# Patient Record
Sex: Female | Born: 1945 | Race: White | Hispanic: No | Smoking: Former smoker
Health system: Southern US, Community
[De-identification: ages and names within clinical notes are randomized; demographics above are authoritative.]

## PROBLEM LIST (undated history)

## (undated) DIAGNOSIS — R32 Unspecified urinary incontinence: Secondary | ICD-10-CM

## (undated) DIAGNOSIS — G14 Postpolio syndrome: Secondary | ICD-10-CM

## (undated) DIAGNOSIS — E785 Hyperlipidemia, unspecified: Secondary | ICD-10-CM

## (undated) DIAGNOSIS — J309 Allergic rhinitis, unspecified: Secondary | ICD-10-CM

## (undated) DIAGNOSIS — F32A Depression, unspecified: Secondary | ICD-10-CM

## (undated) DIAGNOSIS — J45909 Unspecified asthma, uncomplicated: Secondary | ICD-10-CM

## (undated) DIAGNOSIS — I10 Essential (primary) hypertension: Secondary | ICD-10-CM

## (undated) DIAGNOSIS — M8580 Other specified disorders of bone density and structure, unspecified site: Secondary | ICD-10-CM

## (undated) DIAGNOSIS — M419 Scoliosis, unspecified: Secondary | ICD-10-CM

## (undated) DIAGNOSIS — F329 Major depressive disorder, single episode, unspecified: Secondary | ICD-10-CM

## (undated) DIAGNOSIS — F419 Anxiety disorder, unspecified: Secondary | ICD-10-CM

## (undated) HISTORY — PX: OTHER SURGICAL HISTORY: SHX169

## (undated) HISTORY — DX: Major depressive disorder, single episode, unspecified: F32.9

## (undated) HISTORY — PX: TUBAL LIGATION: SHX77

## (undated) HISTORY — DX: Essential (primary) hypertension: I10

## (undated) HISTORY — DX: Anxiety disorder, unspecified: F41.9

## (undated) HISTORY — DX: Hyperlipidemia, unspecified: E78.5

## (undated) HISTORY — DX: Postpolio syndrome: G14

## (undated) HISTORY — DX: Other specified disorders of bone density and structure, unspecified site: M85.80

## (undated) HISTORY — PX: BREAST BIOPSY: SHX20

## (undated) HISTORY — DX: Allergic rhinitis, unspecified: J30.9

## (undated) HISTORY — DX: Unspecified asthma, uncomplicated: J45.909

## (undated) HISTORY — DX: Depression, unspecified: F32.A

## (undated) HISTORY — DX: Scoliosis, unspecified: M41.9

## (undated) HISTORY — DX: Unspecified urinary incontinence: R32

---
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
## 1967-08-22 HISTORY — PX: CHOLECYSTECTOMY: SHX55

## 1977-08-21 HISTORY — PX: BACK SURGERY: SHX140

## 2008-04-12 ENCOUNTER — Ambulatory Visit: Payer: Self-pay | Admitting: Infectious Diseases

## 2008-04-12 ENCOUNTER — Inpatient Hospital Stay (HOSPITAL_COMMUNITY): Admission: EM | Admit: 2008-04-12 | Discharge: 2008-04-13 | Payer: Self-pay

## 2010-07-21 LAB — HEPATIC FUNCTION PANEL
AST: 35 U/L (ref 13–35)
Alkaline Phosphatase: 96 U/L (ref 25–125)
Bilirubin, Total: 1 mg/dL

## 2010-07-21 LAB — LIPID PANEL
Cholesterol: 162 mg/dL (ref 0–200)
LDL Cholesterol: 89 mg/dL

## 2010-07-21 LAB — BASIC METABOLIC PANEL: Glucose: 86 mg/dL

## 2010-09-11 ENCOUNTER — Encounter: Payer: Self-pay | Admitting: Family Medicine

## 2011-01-03 NOTE — Discharge Summary (Signed)
Wendy Yates, Wendy Yates               ACCOUNT NO.:  [id number]   MEDICAL RECORD NO.:  [id number]          PATIENT TYPE:  INP   LOCATION:  6730                         FACILITY:  [facility]   PHYSICIAN:  Mick Sell, MD DATE OF BIRTH:  Nov 09, 1945   DATE OF ADMISSION:  04/12/2008  DATE OF DISCHARGE:  04/13/2008                               DISCHARGE SUMMARY   DISCHARGE DIAGNOSES:  1. Fevers, myalgia, headache, and rash.  2. Depression.  3. Postpolio syndrome.  4. Seasonal allergies.  5. Hyperlipidemia.   DISCHARGE MEDICATIONS:  1. Singulair 10 mg p.o. daily.  2. Zoloft 50 mg p.o. daily.  3. Buspirone 50 mg p.o. b.i.d.  4. Lipitor 10 mg p.o. daily.  5. Doxycycline 100 mg p.o. b.i.d. x6 days.  6. Ibuprofen 400 mg p.o. q.6 h. p.r.n. pain.   DISPOSITION AND FOLLOWUP:  The patient is to return to the outpatient  clinic on May 07, 2008 at 2:30 p.m. to see Dr. Comer Locket.  The  patient will be a new patient in our clinic and her PCP should be Dr.  Joaquin Courts.  The patient will need to have a repeat CMET, UA, and a  repeat [facility] spotted fever serology.  The patient should also  have her RPR and her RMSF that was checked at admission to make sure  those were negative.  The patient also should check for resolution of  her rash as well as her left knee arthritis pain.  The patient will need  a repeat CT of the chest to follow up her small lung nodule.  This  should be done in 3-6 months.   PROCEDURES PERFORMED:  The patient had a chest x-ray done on April 12, 2008.  Impression:  1. No acute cardiopulmonary disease.  2. Thoracolumbar scoliosis with Harrington rod.   The patient has CT of the abdomen with and without contrast.  Impression:  No acute abnormality.  There is an 8-mm right lower lobe  lung nodule.  Chest CT will be useful evaluating for other lobe nodules  establishing a baseline for followup.   The patient also had a CT of the pelvis done on April 12, 2008.  Impression:  No acute abnormality.   There were no consultation made during hospitalization.   Brief H and P is as follows:   CHIEF COMPLAINT:  Body aches, fevers, and rash.   The patient's PCP is Dr. Michelle Nasuti in [place], but she is going to be  moving to [place].   HISTORY OF PRESENT ILLNESS:  The patient is a 65 year old with past  medical history significant for postpolio syndrome, presenting to the ER  with her sister with body aches and fevers over the last 2 days.  She  was treated at an Urgent Care in [place] for UTI and started on  Macrobid on April 04, 2008, but was notified by the Urgent Care Clinic  that the organism that grew from her urine was resistant and she was  switched to Bactrim 3 days prior to admission.  Her dysuria and urinary  frequency have somewhat improved.  However, she has developed fevers,  rigors, myalgias, headache, and back pain since.  She reports good  compliance with her antibiotics.  She denies cough, chest pain, and  shortness of breath.  She does have significant suprapubic abdominal  pain and nausea, but no vomiting and is tolerating p.o.'s.  She has been  visiting her sister at her [place] at [place] recently and notes her  dog had 2 tick bites, but no known bites or so.   ALLERGIES:  SULFA, which caused a rash and myalgias and question serum  sickness-like syndrome.   PAST MEDICAL HISTORY:  1. History of polio and chronic pain.  2. Recent UTI with resistant organism not sure of what that is at this      time.  3. Seasonal allergies.  4. Hyperlipidemia.  5. Depression/anxiety.   HOME MEDICATIONS:  1. Singulair 10 mg p.o. nightly.  2. Zoloft 50 mg p.o. daily.  3. Buspirone 15 mg p.o. b.i.d.  4. Lipitor 10 mg daily.   SUBSTANCE HISTORY:  She is a current smoker, half a pack a day x20  years.  She denies alcohol, cocaine, or IV drugs.  She is divorced,  disabled from polio.  She has Medicare.  She currently lives  alone, but  has a supportive sister.   FAMILY HISTORY:  Mother is 1 and living.  She has 2 healthy siblings  and her father has diabetes mellitus.   REVIEW OF SYSTEMS:  As mentioned in the HPI.   PHYSICAL EXAMINATION:  VITAL SIGNS:  Temperature equals 100.3, blood  pressure 120/80, pulse equals 111, respiratory rate equals 20, and O2  sats 95% on room air.  GENERAL:  She is alert and oriented x3 and pleasant, no distress.  EYES:  PERRLA, EOMI.  ENT:  MMM.  NECK:  Supple.  No lymphadenopathy.  RESPIRATORY:  Decreased air entry in the bases, normal effort.  No  crackles or wheezing.  CV:  Borderline tachycardia, regular rhythm, question of systolic  ejection murmur 1/6 best heard at the lower sternal border.  No rubs.  No gallops.  GI:  Positive bowel sounds, soft, tenderness to palpation  in the suprapubic region and right and left lower quadrants, and without  rebound or guarding.  Well-healed incisions noted.  EXTREMITIES:  No edema, multi well-healed incisions.  SKIN:  Erythematous rash, macular in nature predominantly around her  scars, worse in the bilateral lower extremities and antecubital fossa.  LYMPH:  No cervical lymphadenopathy.  MUSCULOSKELETAL:  Strength mildly decreased in her lower extremities,  but the patient notes this is chronic.  NEUROLOGIC:  Nonfocal.  PSYCH:  Mood is normal.  No hallucinations or delusions.   INITIAL LABORATORY DATA:  Sodium 134, potassium 4.1, chloride 101,  bicarb 29, BUN 14, creatinine 0.9, and glucose 122.  WBC 11.4,  hemoglobin 13.9, platelets 220, ANC 10.9, and MCV 93.6.  UA cloudy,  specific gravity 1.030, small amount of bilirubin, ketones equals 15,  nitrite negative, leukocytes small, wbc's 0-2, and rbc's 0-2.  Chest x-  ray, no acute findings.  Thoracolumbar scoliosis with rod noted.   HOSPITAL COURSE BY PROBLEM:  1. Fevers, myalgias, headache, and rash, etiology includes infectious      versus drug reaction given the recent  sulfa initiation and her      symptoms.  The patient was put on ceftriaxone and doxycycline for      possible RMSF.  Her Bactrim was also discontinued and added to her  allergy list.  The patient did have this rash in the antecubital as      well as in her ankle, it is usually along scars.  She felt much      better the second day and wanted to go home.  Of note, she also had      elevated liver enzymes, which could occur with RMSF or drug      reactions.  She will need outpatient follow up to make sure that      her liver enzymes have returned to normal.  The patient should also      have a repeat urinalysis to make sure that she does not have      infection.  The patient will have RPR and RMSF serology checked      prior to discharge, which will need to be checked in the outpatient      setting.  She will also need repeat RMSF serology at this time.      She will follow up in the outpatient clinic and become our patient      whose PCP will be Dr. Joaquin Courts.  The patient will be      discharged on doxycycline 100 mg p.o. b.i.d. x6 more days.  The      patient will be instructed that she gets worse or does not feel      well that she should come back into the ER, call the clinic      immediately.  2. Depression/anxiety.  The patient will be continued on her home      medicines and will be discharged on Zoloft and buspirone.  3. Postpolio syndrome.  The patient will be sent home with ibuprofen.      This will also help with her left knee arthritis, which she      experienced while she is in the hospital.  4. Seasonal allergies.  The patient will be continued on Singulair.  5. Hyperlipidemia.  The patient will be continued on her Lipitor and      she will need a FLP if this has not been done recently.   DISCHARGE LABORATORY DATA:  Blood culture negative x2.  Sodium 136,  potassium 3.9, chloride 106, bicarb 24, glucose 106, BUN 5, creatinine  0.49, and calcium 7.9.  Her WBC is  6.8, hemoglobin 11.9, hematocrit  35.9, platelets 183, and ESR 23.  Of note, she also had a CMET, which  showed total bilirubin 1.1, alkaline phosphatase 115, AST 84, ALT 38,  total protein 6.2, albumin 3.6, calcium 8.9.   DISCHARGE VITAL SIGNS:  Temperature 98.1, pulse 87, respiratory rate 22,  blood pressure 114/74, and O2 sats 98% on room air.     Rufina Falco, M.D.  Electronically Signed      Mick Sell, MD  Electronically Signed   JY/MEDQ  D:  04/13/2008  T:  04/14/2008  Job:  [id number]   cc:   Blondell Reveal, MD

## 2011-10-25 ENCOUNTER — Telehealth: Payer: Self-pay | Admitting: Internal Medicine

## 2011-10-25 NOTE — Telephone Encounter (Signed)
Received copies from [facility] ,on3/6/13 . Forwarded 20  pages to Dr. ,for review.

## 2011-10-26 ENCOUNTER — Telehealth: Payer: Self-pay | Admitting: Internal Medicine

## 2011-10-26 NOTE — Telephone Encounter (Signed)
Received 44 pages of medical records from [facility], sent to Dr. Mady Haagensen. sd 10/26/2011.

## 2011-11-28 ENCOUNTER — Ambulatory Visit (INDEPENDENT_AMBULATORY_CARE_PROVIDER_SITE_OTHER): Payer: Medicare Other | Admitting: Internal Medicine

## 2011-11-28 ENCOUNTER — Encounter: Payer: Self-pay | Admitting: Internal Medicine

## 2011-11-28 ENCOUNTER — Other Ambulatory Visit (INDEPENDENT_AMBULATORY_CARE_PROVIDER_SITE_OTHER): Payer: Medicare Other

## 2011-11-28 VITALS — BP 142/92 | HR 80 | Temp 97.9°F | Ht 60.0 in | Wt 164.8 lb

## 2011-11-28 DIAGNOSIS — F419 Anxiety disorder, unspecified: Secondary | ICD-10-CM | POA: Insufficient documentation

## 2011-11-28 DIAGNOSIS — Z1239 Encounter for other screening for malignant neoplasm of breast: Secondary | ICD-10-CM

## 2011-11-28 DIAGNOSIS — Z0000 Encounter for general adult medical examination without abnormal findings: Secondary | ICD-10-CM

## 2011-11-28 DIAGNOSIS — I10 Essential (primary) hypertension: Secondary | ICD-10-CM

## 2011-11-28 DIAGNOSIS — M4120 Other idiopathic scoliosis, site unspecified: Secondary | ICD-10-CM

## 2011-11-28 DIAGNOSIS — B91 Sequelae of poliomyelitis: Secondary | ICD-10-CM

## 2011-11-28 DIAGNOSIS — J309 Allergic rhinitis, unspecified: Secondary | ICD-10-CM | POA: Insufficient documentation

## 2011-11-28 DIAGNOSIS — F329 Major depressive disorder, single episode, unspecified: Secondary | ICD-10-CM | POA: Insufficient documentation

## 2011-11-28 DIAGNOSIS — J45909 Unspecified asthma, uncomplicated: Secondary | ICD-10-CM | POA: Insufficient documentation

## 2011-11-28 DIAGNOSIS — M419 Scoliosis, unspecified: Secondary | ICD-10-CM

## 2011-11-28 DIAGNOSIS — E039 Hypothyroidism, unspecified: Secondary | ICD-10-CM

## 2011-11-28 DIAGNOSIS — M8580 Other specified disorders of bone density and structure, unspecified site: Secondary | ICD-10-CM | POA: Insufficient documentation

## 2011-11-28 DIAGNOSIS — E785 Hyperlipidemia, unspecified: Secondary | ICD-10-CM

## 2011-11-28 DIAGNOSIS — Z23 Encounter for immunization: Secondary | ICD-10-CM

## 2011-11-28 DIAGNOSIS — Z1211 Encounter for screening for malignant neoplasm of colon: Secondary | ICD-10-CM

## 2011-11-28 DIAGNOSIS — G14 Postpolio syndrome: Secondary | ICD-10-CM | POA: Insufficient documentation

## 2011-11-28 LAB — LIPID PANEL
Cholesterol: 185 mg/dL (ref 0–200)
LDL Cholesterol: 102 mg/dL — ABNORMAL HIGH (ref 0–99)
Total CHOL/HDL Ratio: 4
Triglycerides: 163 mg/dL — ABNORMAL HIGH (ref 0.0–149.0)
VLDL: 32.6 mg/dL (ref 0.0–40.0)

## 2011-11-28 LAB — CBC WITH DIFFERENTIAL/PLATELET
Basophils Relative: 0.6 % (ref 0.0–3.0)
Eosinophils Absolute: 0.4 10*3/uL (ref 0.0–0.7)
Eosinophils Relative: 7.6 % — ABNORMAL HIGH (ref 0.0–5.0)
HCT: 45.3 % (ref 36.0–46.0)
Lymphs Abs: 2.1 10*3/uL (ref 0.7–4.0)
MCHC: 33.2 g/dL (ref 30.0–36.0)
MCV: 91.4 fl (ref 78.0–100.0)
Monocytes Absolute: 0.5 10*3/uL (ref 0.1–1.0)
Neutro Abs: 2 10*3/uL (ref 1.4–7.7)
RBC: 4.95 Mil/uL (ref 3.87–5.11)
WBC: 4.9 10*3/uL (ref 4.5–10.5)

## 2011-11-28 LAB — TSH: TSH: 2.09 u[IU]/mL (ref 0.35–5.50)

## 2011-11-28 LAB — HEPATIC FUNCTION PANEL
Albumin: 4.7 g/dL (ref 3.5–5.2)
Bilirubin, Direct: 0.2 mg/dL (ref 0.0–0.3)
Total Protein: 7.5 g/dL (ref 6.0–8.3)

## 2011-11-28 LAB — BASIC METABOLIC PANEL
CO2: 30 mEq/L (ref 19–32)
Chloride: 101 mEq/L (ref 96–112)
Potassium: 3.8 mEq/L (ref 3.5–5.1)

## 2011-11-28 MED ORDER — SERTRALINE HCL 50 MG PO TABS: 50.0000 mg | ORAL_TABLET | Freq: Every day | ORAL | Status: DC

## 2011-11-28 MED ORDER — MONTELUKAST SODIUM 10 MG PO TABS: 10.0000 mg | ORAL_TABLET | Freq: Every day | ORAL | Status: DC

## 2011-11-28 MED ORDER — BUSPIRONE HCL 15 MG PO TABS: 15.0000 mg | ORAL_TABLET | Freq: Two times a day (BID) | ORAL | Status: DC

## 2011-11-28 NOTE — Assessment & Plan Note (Signed)
In Lipitor for same since 2011 Check lipids now and annually - titrate as needed

## 2011-11-28 NOTE — Progress Notes (Signed)
Subjective:    Patient ID: Wendy Yates, female    DOB: 17-Sep-1945, 66 y.o.   MRN: [id number]  HPI  New patient to me and our practice, here today to establish care Also patient is here today for annual physical. Patient feels well overall.  Reviewed chronic medical issues today: Hypertension. History of same but has declined medication treatment for same. Reports related to pain with exertion and weight gain. (25 pound weight gain since 2011 related to tobacco cessation, down 10 pounds since largest weight)  Dyslipidemia. On atorvastatin since 2011. the patient reports compliance with medication(s) as prescribed. Denies adverse side effects.  Postpolio syndrome. Polio diagnosed age 18. Chronic right lower extremity weakness, no significant change over past 5 years per patient. Uses e-stim at home as needed since 2007. Reports balance and activity have improved since joining silver sneakers and 2011, works out 4 days per week - walks with cane assistance when out of the home, has electronic wheelchair for inside the home mobility.  Scoliosis, severe. Status post Harrington rod surgery in 1979. e-stim ongoing at home as needed for pain. Uses only Aleve for unrelieved pain. ?new back brace  Past Medical History  Diagnosis Date  . Asthma, extrinsic   . Hypertension   . Osteopenia     DEXA 12/19/07: -2.1; 08/17/04: -1.6  . Hyperlipidemia   . Post-polio syndrome     polio age 63,  RLE most affected  . Depression   . Anxiety   . Urine incontinence   . Allergic rhinitis, cause unspecified   . Scoliosis     1979 harrington rod surgery   Family History  Problem Relation Age of Onset  . Alcohol abuse Mother   . Arthritis Mother   . Arthritis Father    History  Substance Use Topics  . Smoking status: Former Smoker -- 0.5 packs/day for 10 years    Types: Cigarettes    Quit date: 08/21/2009  . Smokeless tobacco: Not on file  . Alcohol Use: No     Review of Systems Constitutional:  Negative for fever or unexpected weight change.  Respiratory: Negative for cough and shortness of breath.   Cardiovascular: Negative for chest pain or palpitations.  Gastrointestinal: Negative for abdominal pain, no bowel changes.  Musculoskeletal: Negative for gait problem or joint swelling.  Skin: Negative for rash.  Neurological: Negative for dizziness or headache.  No other specific complaints in a complete review of systems (except as listed in HPI above).     Objective:   Physical Exam BP 142/92  Pulse 80  Temp(Src) 97.9 F (36.6 C) (Oral)  Ht 5' (1.524 m)  Wt 164 lb 12.8 oz (74.753 kg)  BMI 32.19 kg/m2  SpO2 95% Wt Readings from Last 3 Encounters:  11/28/11 164 lb 12.8 oz (74.753 kg)   Constitutional: She is short stature, overweight, but appears well-developed and well-nourished. No distress.  friend (neighbor) at side HENT: Head: Normocephalic and atraumatic. Ears: B TMs ok, no erythema or effusion; Nose: Nose normal.  Mouth/Throat: Oropharynx is clear and moist. No oropharyngeal exudate.  Eyes: Conjunctivae and EOM are normal. Pupils are equal, round, and reactive to light. No scleral icterus.  Neck: Normal range of motion. Neck supple. No JVD present. No thyromegaly present.  Cardiovascular: Normal rate, regular rhythm and normal heart sounds.  No murmur heard. No BLE edema. Pulmonary/Chest: Effort normal and breath sounds normal. No respiratory distress. She has no wheezes.  Abdominal: Soft. Bowel sounds are normal. She exhibits no  distension. There is no tenderness. no masses Musculoskeletal: Significant rightward scoliosis. 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 Status post hammertoe repair bilaterally.  Neurological: She is alert and oriented to person, place, and time. No cranial nerve deficit. Coordination normal. Weak (3+/5) R hip flexors Skin: Skin is warm and dry. No rash noted. No erythema.  Psychiatric: She  has a normal mood and affect. Her behavior is normal. Judgment and thought content normal.   Lab Results  Component Value Date   CHOL 162 07/21/2010   TRIG 122 07/21/2010   HDL 49 07/21/2010   LDLCALC 89 07/21/2010   ALT 19 07/21/2010   AST 35 07/21/2010   NA 137 07/21/2010   K 4.2 07/21/2010   CREATININE 0.5 07/21/2010   BUN 13 07/21/2010       Assessment & Plan:  CPX/v70.0 - Patient has been counseled on age-appropriate routine health concerns for screening and prevention. These are reviewed and up-to-date (pt agrees to colo and mammo after discussion). Immunizations are up-to-date or declined. Labs ordered and will be reviewed.  Also See problem list. Medications and labs reviewed today. Records from former PCP provider in [place] at [facility] reviewed and copied into electronic record today

## 2011-11-28 NOTE — Assessment & Plan Note (Signed)
Describes fatigue but no progression of chronic RLE weakness Very functional - works out 4d/week with silver sneakers - Using E stim at home for pain relief and uses cane for ambulation assistance -  see discussion above re: overlap of symptoms with scoliosis syndrome

## 2011-11-28 NOTE — Assessment & Plan Note (Signed)
Declines med treatment for same Working on weight reduction and DASH diet -  Will watch for now without treatment at pt request BP Readings from Last 3 Encounters:  11/28/11 142/92

## 2011-11-28 NOTE — Assessment & Plan Note (Signed)
Back symptoms and exam complicated by overlapping postpolio syndrome affecting right lower extremity. Patient requesting brace to assist with upright position. Last PT done 2007 - uses E stim as needed at home for pain control Will refer to orthopedic spine for review and evaluation, but may need neurologic evaluation if felt to be related to progression of postpolio syndrome rather than scoliosis deficit

## 2011-11-28 NOTE — Patient Instructions (Signed)
It was good to see you today. We have reviewed your prior records including labs and tests today Test(s) ordered today. Your results will be called to you after review (48-72hours after test completion). If any changes need to be made, you will be notified at that time. Medications reviewed, no changes at this time. Refill on medication(s) as discussed today. we'll make referral to GI for colonoscopy and for mammogram screening. Our office will contact you regarding appointment(s) once made. Health Maintenance reviewed - Pneumovax given - other screening referrals as above. Please schedule followup in 3-4 months for recheck and review, call sooner if problems.

## 2011-12-18 ENCOUNTER — Other Ambulatory Visit: Payer: Self-pay

## 2011-12-18 MED ORDER — ALBUTEROL SULFATE HFA 108 (90 BASE) MCG/ACT IN AERS: 2.0000 | INHALATION_SPRAY | Freq: Four times a day (QID) | RESPIRATORY_TRACT | Status: DC | PRN

## 2011-12-19 ENCOUNTER — Ambulatory Visit
Admission: RE | Admit: 2011-12-19 | Discharge: 2011-12-19 | Disposition: A | Discharge status: Home / Self Care | Payer: Medicare Other | Source: Ambulatory Visit | Attending: Internal Medicine | Admitting: Internal Medicine

## 2011-12-19 DIAGNOSIS — Z1239 Encounter for other screening for malignant neoplasm of breast: Secondary | ICD-10-CM

## 2012-02-27 ENCOUNTER — Encounter: Payer: Self-pay | Admitting: Internal Medicine

## 2012-02-27 ENCOUNTER — Ambulatory Visit (INDEPENDENT_AMBULATORY_CARE_PROVIDER_SITE_OTHER)
Admission: RE | Admit: 2012-02-27 | Discharge: 2012-02-27 | Disposition: A | Discharge status: Home / Self Care | Payer: Medicare Other | Source: Ambulatory Visit

## 2012-02-27 ENCOUNTER — Ambulatory Visit (INDEPENDENT_AMBULATORY_CARE_PROVIDER_SITE_OTHER): Payer: Medicare Other | Admitting: Internal Medicine

## 2012-02-27 ENCOUNTER — Encounter: Payer: Self-pay | Admitting: Gastroenterology

## 2012-02-27 VITALS — BP 152/80 | HR 75 | Temp 97.1°F | Ht 60.0 in | Wt 177.6 lb

## 2012-02-27 DIAGNOSIS — I10 Essential (primary) hypertension: Secondary | ICD-10-CM

## 2012-02-27 DIAGNOSIS — M4120 Other idiopathic scoliosis, site unspecified: Secondary | ICD-10-CM

## 2012-02-27 DIAGNOSIS — M949 Disorder of cartilage, unspecified: Secondary | ICD-10-CM

## 2012-02-27 DIAGNOSIS — M8580 Other specified disorders of bone density and structure, unspecified site: Secondary | ICD-10-CM

## 2012-02-27 DIAGNOSIS — B91 Sequelae of poliomyelitis: Secondary | ICD-10-CM

## 2012-02-27 DIAGNOSIS — G14 Postpolio syndrome: Secondary | ICD-10-CM

## 2012-02-27 DIAGNOSIS — M419 Scoliosis, unspecified: Secondary | ICD-10-CM

## 2012-02-27 MED ORDER — CLOBETASOL PROPIONATE 0.05 % EX CREA: TOPICAL_CREAM | Freq: Two times a day (BID) | CUTANEOUS | Status: AC

## 2012-02-27 MED ORDER — LOSARTAN POTASSIUM 100 MG PO TABS: 100.0000 mg | ORAL_TABLET | Freq: Every day | ORAL | Status: DC

## 2012-02-27 NOTE — Progress Notes (Signed)
  Subjective:    Patient ID: Wendy Yates, female    DOB: 05/10/1946, 66 y.o.   MRN: [id number]  HPI  Here for follow up - reviewed chronic medical issues today:  Hypertension. History of same but has declined medication treatment for same (until 7/13). Reports related to pain with exertion and weight gain. (25 pound weight gain since 2011 related to tobacco cessation, down 10 pounds since largest weight)  Dyslipidemia. On atorvastatin since 2011. the patient reports compliance with medication(s) as prescribed. Denies adverse side effects.  Postpolio syndrome. Polio diagnosed age 62. Chronic right lower extremity weakness, no significant change over past 5 years per patient. Uses e-stim at home as needed since 2007. Reports balance and activity have improved since joining silver sneakers and 2011, works out 4 days per week - walks with cane assistance when out of the home, has electronic wheelchair for inside the home mobility.  Scoliosis, severe. Status post Harrington rod surgery in 1979. e-stim ongoing at home as needed for pain. Uses only Aleve for unrelieved pain.   Past Medical History  Diagnosis Date  . Asthma, extrinsic   . Hypertension   . Osteopenia     DEXA 12/19/07: -2.1; 08/17/04: -1.6  . Hyperlipidemia   . Post-polio syndrome     polio age 50,  RLE most affected  . Depression   . Anxiety   . Urine incontinence   . Allergic rhinitis, cause unspecified   . Scoliosis     1979 harrington rod surgery     Review of Systems  Constitutional: Negative for fever or unexpected weight change.  Respiratory: Negative for cough and shortness of breath.   Cardiovascular: Negative for chest pain or palpitations.      Objective:   Physical Exam  BP 152/80  Pulse 75  Temp 97.1 F (36.2 C) (Oral)  Ht 5' (1.524 m)  Wt 177 lb 9.6 oz (80.559 kg)  BMI 34.69 kg/m2  SpO2 94% Wt Readings from Last 3 Encounters:  02/27/12 177 lb 9.6 oz (80.559 kg)  11/28/11 164 lb 12.8 oz (74.753  kg)   Constitutional: She is short stature, overweight, but appears well-developed and well-nourished. No distress.   Cardiovascular: Normal rate, regular rhythm and normal heart sounds.  No murmur heard. No BLE edema. Pulmonary/Chest: Effort normal and breath sounds normal. No respiratory distress. She has no wheezes.  Musculoskeletal: Significant rightward scoliosis. 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 Status post hammertoe repair bilaterally.  Neurological: She is alert and oriented to person, place, and time. No cranial nerve deficit. Coordination normal. Weak (3+/5) R hip flexors Psychiatric: She has a normal mood and affect. Her behavior is normal. Judgment and thought content normal.   Lab Results  Component Value Date   WBC 4.9 11/28/2011   HGB 15.0 11/28/2011   HCT 45.3 11/28/2011   PLT 280.0 11/28/2011   GLUCOSE 94 11/28/2011   CHOL 185 11/28/2011   TRIG 163.0* 11/28/2011   HDL 50.40 11/28/2011   LDLCALC 102* 11/28/2011   ALT 15 11/28/2011   AST 28 11/28/2011   NA 140 11/28/2011   K 3.8 11/28/2011   CL 101 11/28/2011   CREATININE 0.5 11/28/2011   BUN 12 11/28/2011   CO2 30 11/28/2011   TSH 2.09 11/28/2011       Assessment & Plan:   See problem list. Medications and labs reviewed today.

## 2012-02-27 NOTE — Assessment & Plan Note (Signed)
Describes fatigue but no progression of chronic RLE weakness Very functional - works out 4d/week with silver sneakers - Using E stim at home for pain relief and uses cane for ambulation assistance -  overlap of symptoms with scoliosis syndrome s/p eval by [employer] specialist summer 2013

## 2012-02-27 NOTE — Patient Instructions (Addendum)
It was good to see you today. We have reviewed your prior records including labs and tests today Will schedule bone density test today Will follow up on colonoscopy scheduling a discussed Start losartan 100mg  once daily and use cream on feet as needed Your prescription(s) have been submitted to your pharmacy. Please take as directed and contact our office if you believe you are having problem(s) with the medication(s). Other Medications reviewed, no changes at this time. Please schedule followup in 3 months for blood pressure recheck, call sooner if problems.

## 2012-02-27 NOTE — Assessment & Plan Note (Signed)
Prior intolerance of bisphos therapy Recheck DEXA now - consider Prolia or other therapy in addition to Ca/VitD

## 2012-02-27 NOTE — Assessment & Plan Note (Signed)
BP Readings from Last 3 Encounters:  02/27/12 152/80  11/28/11 142/92   Uncontrolled -  Will start ARB now - we reviewed potential risk/benefit and possible side effects - pt understands and agrees to same   Recheck in 6 weeks, sooner if problems

## 2012-02-27 NOTE — Assessment & Plan Note (Signed)
Back symptoms and exam complicated by overlapping postpolio syndrome affecting right lower extremity. S/p eval by scoli specialist Noel Gerold) spring/summer 2013 PT done 2007 and 2013 - uses E stim as needed at home for pain control ?t may need future neurologic evaluation if felt to be related to progression of postpolio syndrome rather than scoliosis deficit

## 2012-03-13 ENCOUNTER — Encounter: Payer: Self-pay | Admitting: Internal Medicine

## 2012-03-18 ENCOUNTER — Encounter: Payer: Self-pay | Admitting: Gastroenterology

## 2012-03-18 ENCOUNTER — Ambulatory Visit (AMBULATORY_SURGERY_CENTER): Payer: Medicare Other | Admitting: *Deleted

## 2012-03-18 VITALS — Ht 61.0 in | Wt 175.4 lb

## 2012-03-18 DIAGNOSIS — Z1211 Encounter for screening for malignant neoplasm of colon: Secondary | ICD-10-CM

## 2012-03-18 MED ORDER — MOVIPREP 100 G PO SOLR: ORAL | Status: DC

## 2012-03-18 NOTE — Progress Notes (Signed)
Notified patient to bring in Inhaler the day of colonoscopy. She understands. Sherren Kerns

## 2012-04-01 ENCOUNTER — Encounter: Payer: Self-pay | Admitting: Gastroenterology

## 2012-04-01 ENCOUNTER — Ambulatory Visit (AMBULATORY_SURGERY_CENTER): Payer: Medicare Other | Admitting: Gastroenterology

## 2012-04-01 VITALS — BP 127/94 | HR 82 | Temp 99.1°F | Resp 20 | Ht 61.0 in | Wt 175.0 lb

## 2012-04-01 DIAGNOSIS — Z1211 Encounter for screening for malignant neoplasm of colon: Secondary | ICD-10-CM

## 2012-04-01 DIAGNOSIS — K5730 Diverticulosis of large intestine without perforation or abscess without bleeding: Secondary | ICD-10-CM

## 2012-04-01 MED ORDER — SODIUM CHLORIDE 0.9 % IV SOLN: 500.0000 mL | INTRAVENOUS | Status: DC

## 2012-04-01 NOTE — Progress Notes (Signed)
CHANGED TO PEDIATRIC SCOPE AFTER 6 MINUTE INSERTION TIME.

## 2012-04-01 NOTE — Progress Notes (Signed)
Patient did not experience any of the following events: a burn prior to discharge; a fall within the facility; wrong site/side/patient/procedure/implant event; or a hospital transfer or hospital admission upon discharge from the facility. (G8907) Patient did not have preoperative order for IV antibiotic SSI prophylaxis. (G8918)  

## 2012-04-01 NOTE — Op Note (Signed)
[facility] [address] [place], [place]  [address]  COLONOSCOPY PROCEDURE REPORT  PATIENT:  Wendy, Yates  MR#:  [id number] BIRTHDATE:  1945-10-05, 65 yrs. old  GENDER:  female ENDOSCOPIST:  Vania Rea. Jarold Motto, MD, [facility] REF. BY:  Rene Paci, M.D. PROCEDURE DATE:  04/01/2012 PROCEDURE:  Average-risk screening colonoscopy G0121 ASA CLASS:  Class II INDICATIONS:  Routine Risk Screening MEDICATIONS:   propofol (Diprivan) 350 mg IV  DESCRIPTION OF PROCEDURE:   After the risks and benefits and of the procedure were explained, informed consent was obtained. Digital rectal exam was performed and revealed no abnormalities. The LB CF-[id number] endoscope was introduced through the anus and advanced to the cecum, which was identified by both the appendix and ileocecal valve.  The quality of the prep was adequate, using MoviPrep.  The instrument was then slowly withdrawn as the colon was fully examined. <<PROCEDUREIMAGES>>  FINDINGS:  Severe diverticulosis was found in the sigmoid to descending colon segments. EXAM REQUIRED PEDIATRIC COLONOSCOPE.VERY HARD EXAM PER SEVERE DIVERTICULOSIS WITH SEVERE EDEMA,SPASM, AND THICKENED HAUSTRAL FOLDS IN LEFT COLON !!!  No polyps or cancers were seen.  This was otherwise a normal examination of the colon.   Retroflexed views in the rectum revealed no abnormalities.    The scope was then withdrawn from the patient and the procedure completed.  COMPLICATIONS:  None ENDOSCOPIC IMPRESSION: 1) Severe diverticulosis in the sigmoid to descending colon segments 2) No polyps or cancers 3) Otherwise normal examination RECOMMENDATIONS: 1) Continue current medications 2) Repeat Colonscopy in 10 years. 3) High fiber diet  REPEAT EXAM:  No  ______________________________ Vania Rea. Jarold Motto, MD, [facility]  CC:  n. eSIGNED:   Vania Rea. Frutoso Dimare at 04/01/2012 11:56 AM  Alexis Goodell, [phone]

## 2012-04-01 NOTE — Patient Instructions (Addendum)
YOU HAD AN ENDOSCOPIC PROCEDURE TODAY AT THE [facility]: Refer to the procedure report that was given to you for any specific questions about what was found during the examination.  If the procedure report does not answer your questions, please call your gastroenterologist to clarify.  If you requested that your care partner not be given the details of your procedure findings, then the procedure report has been included in a sealed envelope for you to review at your convenience later.  YOU SHOULD EXPECT: Some feelings of bloating in the abdomen. Passage of more gas than usual.  Walking can help get rid of the air that was put into your GI tract during the procedure and reduce the bloating. If you had a lower endoscopy (such as a colonoscopy or flexible sigmoidoscopy) you may notice spotting of blood in your stool or on the toilet paper. If you underwent a bowel prep for your procedure, then you may not have a normal bowel movement for a few days.  DIET: Your first meal following the procedure should be a light meal and then it is ok to progress to your normal diet.  A half-sandwich or bowl of soup is an example of a good first meal.  Heavy or fried foods are harder to digest and may make you feel nauseous or bloated.  Likewise meals heavy in dairy and vegetables can cause extra gas to form and this can also increase the bloating.  Drink plenty of fluids but you should avoid alcoholic beverages for 24 hours.  ACTIVITY: Your care partner should take you home directly after the procedure.  You should plan to take it easy, moving slowly for the rest of the day.  You can resume normal activity the day after the procedure however you should NOT DRIVE or use heavy machinery for 24 hours (because of the sedation medicines used during the test).    SYMPTOMS TO REPORT IMMEDIATELY: A gastroenterologist can be reached at any hour.  During normal business hours, 8:30 AM to 5:00 PM Monday through Friday,  call [phone].  After hours and on weekends, please call the GI answering service at [phone] who will take a message and have the physician on call contact you.   Following lower endoscopy (colonoscopy or flexible sigmoidoscopy):  Excessive amounts of blood in the stool  Significant tenderness or worsening of abdominal pains  Swelling of the abdomen that is new, acute  Fever of 100F or higher    FOLLOW UP: If any biopsies were taken you will be contacted by phone or by letter within the next 1-3 weeks.  Call your gastroenterologist if you have not heard about the biopsies in 3 weeks.  Our staff will call the home number listed on your records the next business day following your procedure to check on you and address any questions or concerns that you may have at that time regarding the information given to you following your procedure. This is a courtesy call and so if there is no answer at the home number and we have not heard from you through the emergency physician on call, we will assume that you have returned to your regular daily activities without incident.  SIGNATURES/CONFIDENTIALITY: You and/or your care partner have signed paperwork which will be entered into your electronic medical record.  These signatures attest to the fact that that the information above on your After Visit Summary has been reviewed and is understood.  Full responsibility of the confidentiality   of this discharge information lies with you and/or your care-partner.     

## 2012-04-02 ENCOUNTER — Telehealth: Payer: Self-pay | Admitting: *Deleted

## 2012-04-02 NOTE — Telephone Encounter (Signed)
  Follow up Call-  Call back number 04/01/2012  Post procedure Call Back phone  # [phone]  Permission to leave phone message Yes     Patient questions:  Do you have a fever, pain , or abdominal swelling? no Pain Score  0 *  Have you tolerated food without any problems? yes  Have you been able to return to your normal activities? yes  Do you have any questions about your discharge instructions: Diet   no Medications  no Follow up visit  no  Do you have questions or concerns about your Care? no  Actions: * If pain score is 4 or above: No action needed, pain <4.  Pt. Had questions about diverticulosis and if they heal up.Informed pt. That once you have it, it does not go away that a high fiber diet is recommeded.

## 2012-05-28 ENCOUNTER — Encounter: Payer: Self-pay | Admitting: Internal Medicine

## 2012-05-28 ENCOUNTER — Ambulatory Visit (INDEPENDENT_AMBULATORY_CARE_PROVIDER_SITE_OTHER): Payer: Medicare Other | Admitting: Internal Medicine

## 2012-05-28 VITALS — BP 134/78 | HR 72 | Temp 97.7°F | Ht 60.0 in | Wt 177.0 lb

## 2012-05-28 DIAGNOSIS — I10 Essential (primary) hypertension: Secondary | ICD-10-CM

## 2012-05-28 DIAGNOSIS — Z23 Encounter for immunization: Secondary | ICD-10-CM

## 2012-05-28 DIAGNOSIS — G14 Postpolio syndrome: Secondary | ICD-10-CM

## 2012-05-28 DIAGNOSIS — M8580 Other specified disorders of bone density and structure, unspecified site: Secondary | ICD-10-CM

## 2012-05-28 DIAGNOSIS — B91 Sequelae of poliomyelitis: Secondary | ICD-10-CM

## 2012-05-28 DIAGNOSIS — M949 Disorder of cartilage, unspecified: Secondary | ICD-10-CM

## 2012-05-28 NOTE — Assessment & Plan Note (Signed)
Prior intolerance of bisphos therapy Last DEXA 02/2012 stable - no tx changes recommended May consider Prolia or other therapy in addition to Ca/VitDif future decline

## 2012-05-28 NOTE — Progress Notes (Signed)
  Subjective:    Patient ID: Wendy Yates, female    DOB: 1946/06/30, 66 y.o.   MRN: [id number]  Hypertension   Here for follow up - reviewed chronic medical issues today:  Hypertension. History of same, started ARB 02/2102. Reports related to pain with exertion and weight gain. (25 pound weight gain since 2011 related to tobacco cessation, down 10 pounds since largest weight)  Dyslipidemia. On atorvastatin since 2011. the patient reports compliance with medication(s) as prescribed. Denies adverse side effects.  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 Uses e-stim at home as needed since 2007. Reports balance and activity have improved since joining silver sneakers and 2011, works out 4 days per week - walks with cane assistance when out of the home, has electronic wheelchair for inside the home mobility.  Scoliosis, severe. Status post Harrington rod surgery in 1979. e-stim ongoing at home as needed for pain. Uses only Aleve for unrelieved pain.   Past Medical History  Diagnosis Date  . Asthma, extrinsic   . Hypertension   . Osteopenia     DEXA 02/2012: -2.0; 12/19/07: -2.1; 08/17/04: -1.6  . Hyperlipidemia   . Post-polio syndrome     polio age 57,  RLE most affected  . Depression   . Anxiety   . Urine incontinence   . Allergic rhinitis, cause unspecified   . Scoliosis     1979 harrington rod surgery   Review of Systems Constitutional: Negative for fever or unexpected weight change.  Respiratory: Negative for cough and shortness of breath.   Cardiovascular: Negative for chest pain or palpitations.      Objective:   Physical Exam  BP 134/78  Pulse 72  Temp 97.7 F (36.5 C) (Oral)  Ht 5' (1.524 m)  Wt 177 lb (80.287 kg)  BMI 34.57 kg/m2  SpO2 97% Wt Readings from Last 3 Encounters:  05/28/12 177 lb (80.287 kg)  04/01/12 175 lb (79.379 kg)  03/18/12 175 lb 6.4 oz (79.561 kg)     Constitutional: She is short stature, overweight, but appears well-developed and well-nourished. No distress.   Cardiovascular: Normal rate, regular rhythm and normal heart sounds.  No murmur heard. No BLE edema. Pulmonary/Chest: Effort normal and breath sounds normal. No respiratory distress. She has no wheezes.  Musculoskeletal: Significant rightward scoliosis. 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 Status post hammertoe repair bilaterally.  Neurological: She is alert and oriented to person, place, and time. No cranial nerve deficit. Coordination normal. Weak (3+/5) R hip flexors Psychiatric: She has a normal mood and affect. Her behavior is normal. Judgment and thought content normal.   Lab Results  Component Value Date   WBC 4.9 11/28/2011   HGB 15.0 11/28/2011   HCT 45.3 11/28/2011   PLT 280.0 11/28/2011   GLUCOSE 94 11/28/2011   CHOL 185 11/28/2011   TRIG 163.0* 11/28/2011   HDL 50.40 11/28/2011   LDLCALC 102* 11/28/2011   ALT 15 11/28/2011   AST 28 11/28/2011   NA 140 11/28/2011   K 3.8 11/28/2011   CL 101 11/28/2011   CREATININE 0.5 11/28/2011   BUN 12 11/28/2011   CO2 30 11/28/2011   TSH 2.09 11/28/2011       Assessment & Plan:   See problem list. Medications and labs reviewed today.

## 2012-05-28 NOTE — Assessment & Plan Note (Signed)
Describes fatigue but no progression of chronic RLE weakness Very functional - works out 4d/week with silver sneakers - Using E stim at home for pain relief and uses cane for ambulation assistance -  Will ask HHPT to help eval for best walker type to have as needed overlap of symptoms with scoliosis syndrome s/p eval by [employer] specialist summer 2013

## 2012-05-28 NOTE — Patient Instructions (Signed)
It was good to see you today. We have reviewed your prior records including labs and tests today continue losartan 100mg  once daily for blood pressure - looks great! we'll make referral to home health to fit your for a walker to have if needed. Our office will contact you regarding appointment(s) once made. Medications reviewed, no changes at this time. Please schedule followup in 6 months for blood pressure recheck, call sooner if problems.

## 2012-05-28 NOTE — Assessment & Plan Note (Signed)
BP Readings from Last 3 Encounters:  05/28/12 134/78  04/01/12 127/94  02/27/12 152/80   started ARB 02/2012 -improved The current medical regimen is effective;  continue present plan and medications.

## 2012-06-12 ENCOUNTER — Telehealth: Payer: Self-pay

## 2012-06-12 DIAGNOSIS — B91 Sequelae of poliomyelitis: Secondary | ICD-10-CM

## 2012-06-12 DIAGNOSIS — M4120 Other idiopathic scoliosis, site unspecified: Secondary | ICD-10-CM

## 2012-06-12 NOTE — Telephone Encounter (Signed)
HHPT called to inform MD that pt has declined services and also did not qualify because she is not home bound. Pt is requesting a 4 wheeled walker faxed to [phone]. Okay to generate?

## 2012-06-12 NOTE — Telephone Encounter (Signed)
Ok Yes thanks

## 2012-06-12 NOTE — Telephone Encounter (Signed)
DME generated and faxed to [facility]

## 2012-08-09 ENCOUNTER — Telehealth: Payer: Self-pay | Admitting: *Deleted

## 2012-08-09 NOTE — Telephone Encounter (Signed)
Noted -  Please ask pt to take 50mg  sertraline each day (same as on our med list - send refill if needed) i also agree with OV as recommended - thanks

## 2012-08-09 NOTE — Telephone Encounter (Signed)
Left message for pt to callback office.  

## 2012-08-09 NOTE — Telephone Encounter (Addendum)
NP with [employer] came out to visit pt for home eval yesterday and during depression screening pt stated that she had thoughts of being better off dead but no suicidal ideations. NP states that depression is uncontrolled. Pt is currently taking 25mg  of Zoloft. NP recommended pt come in for OV, but pt declined. NP wanted MD to be aware.

## 2012-08-12 ENCOUNTER — Telehealth: Payer: Self-pay | Admitting: *Deleted

## 2012-08-12 NOTE — Telephone Encounter (Signed)
Returned patient call from Friday in regards to [facility] nurse concerns of visit last week.  Patient states she was upset that morning with some situations she has had to handle but is better now and is currently back on her medication Zoloft 25mg  and will continue with this until after the holidays and will follow up with Dr. Felicity Coyer.

## 2012-08-12 NOTE — Telephone Encounter (Signed)
Wendy Morrison, LPN 16/05/9603 5:40 AM Signed  Returned patient call from Friday in regards to [facility] nurse concerns of visit last week. Patient states she was upset that morning with some situations she has had to handle but is better now and is currently back on her medication Zoloft 25mg  and will continue with this until after the holidays and will follow up with Dr. Felicity Coyer.

## 2012-11-22 ENCOUNTER — Other Ambulatory Visit: Payer: Self-pay | Admitting: Internal Medicine

## 2012-11-25 ENCOUNTER — Other Ambulatory Visit: Payer: Self-pay | Admitting: Internal Medicine

## 2012-11-28 ENCOUNTER — Encounter: Payer: Self-pay | Admitting: Internal Medicine

## 2012-11-28 ENCOUNTER — Other Ambulatory Visit (INDEPENDENT_AMBULATORY_CARE_PROVIDER_SITE_OTHER): Payer: Medicare Other

## 2012-11-28 ENCOUNTER — Ambulatory Visit (INDEPENDENT_AMBULATORY_CARE_PROVIDER_SITE_OTHER): Payer: Medicare Other | Admitting: Internal Medicine

## 2012-11-28 VITALS — BP 138/82 | HR 77 | Temp 98.1°F | Wt 173.0 lb

## 2012-11-28 DIAGNOSIS — B91 Sequelae of poliomyelitis: Secondary | ICD-10-CM

## 2012-11-28 DIAGNOSIS — I10 Essential (primary) hypertension: Secondary | ICD-10-CM

## 2012-11-28 DIAGNOSIS — E785 Hyperlipidemia, unspecified: Secondary | ICD-10-CM

## 2012-11-28 DIAGNOSIS — F329 Major depressive disorder, single episode, unspecified: Secondary | ICD-10-CM

## 2012-11-28 DIAGNOSIS — J45901 Unspecified asthma with (acute) exacerbation: Secondary | ICD-10-CM

## 2012-11-28 DIAGNOSIS — G14 Postpolio syndrome: Secondary | ICD-10-CM

## 2012-11-28 LAB — LIPID PANEL
LDL Cholesterol: 93 mg/dL (ref 0–99)
Total CHOL/HDL Ratio: 4

## 2012-11-28 MED ORDER — MONTELUKAST SODIUM 10 MG PO TABS: ORAL_TABLET | ORAL | Status: DC

## 2012-11-28 MED ORDER — ALBUTEROL SULFATE (2.5 MG/3ML) 0.083% IN NEBU: 2.5000 mg | INHALATION_SOLUTION | Freq: Four times a day (QID) | RESPIRATORY_TRACT | Status: DC | PRN

## 2012-11-28 MED ORDER — PREDNISONE (PAK) 10 MG PO TABS: 10.0000 mg | ORAL_TABLET | ORAL | Status: DC

## 2012-11-28 MED ORDER — BUSPIRONE HCL 15 MG PO TABS: ORAL_TABLET | ORAL | Status: DC

## 2012-11-28 MED ORDER — ALBUTEROL SULFATE HFA 108 (90 BASE) MCG/ACT IN AERS: 2.0000 | INHALATION_SPRAY | Freq: Four times a day (QID) | RESPIRATORY_TRACT | Status: DC | PRN

## 2012-11-28 MED ORDER — ATORVASTATIN CALCIUM 10 MG PO TABS: 10.0000 mg | ORAL_TABLET | Freq: Every day | ORAL | Status: DC

## 2012-11-28 MED ORDER — OMEPRAZOLE 20 MG PO CPDR: 20.0000 mg | DELAYED_RELEASE_CAPSULE | Freq: Every day | ORAL | Status: DC

## 2012-11-28 NOTE — Assessment & Plan Note (Signed)
on Lipitor for same since 2011 Check lipids now and annually - titrate as needed

## 2012-11-28 NOTE — Assessment & Plan Note (Signed)
BP Readings from Last 3 Encounters:  11/28/12 138/82  05/28/12 134/78  04/01/12 127/94   started ARB 02/2012 -improved The current medical regimen is effective;  continue present plan and medications.

## 2012-11-28 NOTE — Assessment & Plan Note (Signed)
Acute flare, likely triggered by allergies of season change pred pak x 6 day, add PPI qd x 30d for nocturnal symptoms  Continue Alb and singular

## 2012-11-28 NOTE — Progress Notes (Signed)
Subjective:    Patient ID: Wendy Yates, female    DOB: 07/20/1946, 67 y.o.   MRN: [id number]  HPI Here for follow up - reviewed chronic medical issues today:  Depression - increase in seasonal symptoms - phone notes reviewed - currently, denies SI/HI - taking sertraline and feels symptoms controlled, but ?future use cymbalta when generic? No irritability  Hypertension. History of same, started ARB 02/2102. Reports related to pain with exertion and weight gain. (25 pound weight gain since 2011 related to tobacco cessation, down 10 pounds since highest weight)  Dyslipidemia. On atorvastatin since 2011. the patient reports compliance with medication(s) as prescribed. Denies adverse side effects.  Postpolio syndrome. Polio diagnosed age 78. Chronic right lower extremity weakness, no significant change over past 5 years per patient. Uses e-stim at home as needed since 2007. Reports balance and activity have improved since joining silver sneakers and 2011, works out 4 days per week - walks with cane assistance when out of the home, has electronic wheelchair for inside the home mobility.  Scoliosis, severe. Status post Harrington rod surgery in 1979. e-stim ongoing at home as needed for pain. Uses only Aleve for unrelieved pain.   complains of asthma flare for past 3 weeks - symptoms worse supine at night  Past Medical History  Diagnosis Date  . Asthma, extrinsic   . Hypertension   . Osteopenia     DEXA 02/2012: -2.0; 12/19/07: -2.1; 08/17/04: -1.6  . Hyperlipidemia   . Post-polio syndrome     polio age 40,  RLE most affected  . Depression   . Anxiety   . Urine incontinence   . Allergic rhinitis, cause unspecified   . Scoliosis     1979 harrington rod surgery   Review of Systems Constitutional: Negative for fever or unexpected weight change.  Respiratory: Negative for cough and shortness of breath.   Cardiovascular: Negative for chest pain or palpitations.      Objective:   Physical  Exam  BP 148/82  Pulse 77  Temp(Src) 98.1 F (36.7 C) (Oral)  Wt 173 lb (78.472 kg)  BMI 33.79 kg/m2  SpO2 94% Wt Readings from Last 3 Encounters:  11/28/12 173 lb (78.472 kg)  05/28/12 177 lb (80.287 kg)  04/01/12 175 lb (79.379 kg)   Constitutional: She is short stature, overweight, but appears well-developed and well-nourished. No distress.   Cardiovascular: Normal rate, regular rhythm and normal heart sounds.  No murmur heard. No BLE edema. Pulmonary/Chest: Effort normal -breath sounds with soft end exp wheeze, esp at L base- otherwise normal. No respiratory distress.  Musculoskeletal: Significant rightward scoliosis. 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 Status post hammertoe repair bilaterally.  Neurological: She is alert and oriented to person, place, and time. No cranial nerve deficit. Coordination normal. Weak (3+/5) R hip flexors Psychiatric: She has a normal mood and affect. Her behavior is normal. Judgment and thought content normal.   Lab Results  Component Value Date   WBC 4.9 11/28/2011   HGB 15.0 11/28/2011   HCT 45.3 11/28/2011   PLT 280.0 11/28/2011   GLUCOSE 94 11/28/2011   CHOL 185 11/28/2011   TRIG 163.0* 11/28/2011   HDL 50.40 11/28/2011   LDLCALC 102* 11/28/2011   ALT 15 11/28/2011   AST 28 11/28/2011   NA 140 11/28/2011   K 3.8 11/28/2011   CL 101 11/28/2011   CREATININE 0.5 11/28/2011   BUN 12 11/28/2011   CO2 30 11/28/2011  TSH 2.09 11/28/2011       Assessment & Plan:   See problem list. Medications and labs reviewed today.  Forms for DMV completed today

## 2012-11-28 NOTE — Assessment & Plan Note (Signed)
symptoms stable at this time consider future change to cymbalta if decline in symptom control, but not needed at this time

## 2012-11-28 NOTE — Patient Instructions (Signed)
It was good to see you today. We have reviewed your prior records including labs and tests today Test(s) ordered today. Your results will be released to MyChart (or called to you) after review, usually within 72hours after test completion. If any changes need to be made, you will be notified at that same time. Medications reviewed and updated. Refill on medication(s) as discussed today. Take prednisone taper for next 6 days to control asthma and allergies, also start Prilosec once daily for the next 30 days Your prescription(s) have been submitted to your pharmacy. Please take as directed and contact our office if you believe you are having problem(s) with the medication(s). DMV forms today completed as requested -these have been returned to you Please schedule followup in 6 months for blood pressure recheck, call sooner if problems.

## 2012-11-28 NOTE — Assessment & Plan Note (Signed)
Describes fatigue but no progression of chronic RLE weakness Very functional - works out 4d/week with silver sneakers - ok to drive (DMV form completed 11/2012) Using E stim at home for pain relief and uses cane for ambulation assistance -  S/p HHPT 05/2012 for eval for best walker type to use as needed overlap of symptoms with scoliosis syndrome s/p eval by scoli specialist summer 2013

## 2013-01-08 ENCOUNTER — Other Ambulatory Visit: Payer: Self-pay | Admitting: Internal Medicine

## 2013-02-10 ENCOUNTER — Ambulatory Visit (INDEPENDENT_AMBULATORY_CARE_PROVIDER_SITE_OTHER): Payer: Medicare Other | Admitting: Internal Medicine

## 2013-02-10 ENCOUNTER — Encounter: Payer: Self-pay | Admitting: Internal Medicine

## 2013-02-10 VITALS — BP 142/82 | HR 77 | Temp 97.9°F | Wt 175.0 lb

## 2013-02-10 DIAGNOSIS — J4541 Moderate persistent asthma with (acute) exacerbation: Secondary | ICD-10-CM

## 2013-02-10 DIAGNOSIS — J45901 Unspecified asthma with (acute) exacerbation: Secondary | ICD-10-CM

## 2013-02-10 DIAGNOSIS — J45909 Unspecified asthma, uncomplicated: Secondary | ICD-10-CM

## 2013-02-10 DIAGNOSIS — B91 Sequelae of poliomyelitis: Secondary | ICD-10-CM

## 2013-02-10 DIAGNOSIS — G14 Postpolio syndrome: Secondary | ICD-10-CM

## 2013-02-10 DIAGNOSIS — J4540 Moderate persistent asthma, uncomplicated: Secondary | ICD-10-CM

## 2013-02-10 MED ORDER — FLUTICASONE-SALMETEROL 250-50 MCG/DOSE IN AEPB: 1.0000 | INHALATION_SPRAY | Freq: Two times a day (BID) | RESPIRATORY_TRACT | Status: DC

## 2013-02-10 MED ORDER — FEXOFENADINE HCL 180 MG PO TABS: 180.0000 mg | ORAL_TABLET | Freq: Every day | ORAL | Status: DC

## 2013-02-10 MED ORDER — OMEPRAZOLE 20 MG PO CPDR: 20.0000 mg | DELAYED_RELEASE_CAPSULE | Freq: Every day | ORAL | Status: DC

## 2013-02-10 NOTE — Assessment & Plan Note (Signed)
Poor control, likely triggered by allergies  Intol of pred pak as rx'd 11/2012 resume PPI qd x 30d for nocturnal symptoms  add Advair bid and refer for PFTs (alos ?post polio effects on shortness of breath) Continue Alb, antihistamine and singular follow up 4 weeks - sooner if worse

## 2013-02-10 NOTE — Patient Instructions (Signed)
It was good to see you today. We have reviewed your prior records including labs and tests today Start Advair 2x/day - Your prescription(s) have been submitted to your pharmacy. Please take as directed and contact our office if you believe you are having problem(s) with the medication(s). Prosthetic once daily, Allegra every day and Singulair every day as prescribed. Okay to continue albuterol for rescue as needed we'll make referral for Pulmonary function testing to further evaluate asthma and polio affects on breathing . Our office will contact you regarding appointment(s) once made. followup in 4 weeks to review results and your symptoms, please call sooner if problems

## 2013-02-10 NOTE — Progress Notes (Signed)
  Subjective:    Patient ID: Wendy Yates, female    DOB: [birth year], 67 y.o.   MRN: [id number]  HPI  complains of continued cough Seen for same 11/2012 - intol of pred due to side effects Using rescue Alb multiple times daily without relief Taking otc antihistamine and Singular as x'd without improvement in symptoms associated with allergy symptoms - see ros below  Past Medical History  Diagnosis Date  . Asthma, extrinsic   . Hypertension   . Osteopenia     DEXA 02/2012: -2.0; 12/19/07: -2.1; 08/17/04: -1.6  . Hyperlipidemia   . Post-polio syndrome     polio age 13,  RLE most affected  . Depression   . Anxiety   . Urine incontinence   . Allergic rhinitis, cause unspecified   . Scoliosis     1979 harrington rod surgery    Review of Systems  Constitutional: Negative for fever, chills, diaphoresis, fatigue and unexpected weight change.  HENT: Positive for congestion, rhinorrhea, sneezing and postnasal drip. Negative for sore throat, trouble swallowing, neck stiffness and sinus pressure.   Respiratory: Positive for cough and wheezing. Negative for choking and shortness of breath.   Cardiovascular: Negative for chest pain, palpitations and leg swelling.       Objective:   Physical Exam BP 142/82  Pulse 77  Temp(Src) 97.9 F (36.6 C) (Oral)  Wt 175 lb (79.379 kg)  BMI 34.18 kg/m2  SpO2 94% Wt Readings from Last 3 Encounters:  02/10/13 175 lb (79.379 kg)  11/28/12 173 lb (78.472 kg)  05/28/12 177 lb (80.287 kg)   Constitutional: She appears well-developed and well-nourished. No distress.  HENT: Head: Normocephalic and atraumatic. Ears: B TMs ok, no erythema or effusion; Nose: Nose normal. Mouth/Throat: Oropharynx is mildl red with clear PND, but moist. No oropharyngeal exudate.  Neck: Normal range of motion. Neck supple. No JVD or LAD present. No thyromegaly present.  Cardiovascular: Normal rate, regular rhythm and normal heart sounds.  No murmur heard. No BLE  edema. Pulmonary/Chest: Effort normal and breath sounds diminished at bases. No respiratory distress at rest. She has soft end exp wheezes upper lobes.  Psychiatric: She has a normal mood and affect. Her behavior is normal. Judgment and thought content normal.   Lab Results  Component Value Date   WBC 4.9 11/28/2011   HGB 15.0 11/28/2011   HCT 45.3 11/28/2011   PLT 280.0 11/28/2011   GLUCOSE 94 11/28/2011   CHOL 158 11/28/2012   TRIG 120.0 11/28/2012   HDL 40.80 11/28/2012   LDLCALC 93 11/28/2012   ALT 15 11/28/2011   AST 28 11/28/2011   NA 140 11/28/2011   K 3.8 11/28/2011   CL 101 11/28/2011   CREATININE 0.5 11/28/2011   BUN 12 11/28/2011   CO2 30 11/28/2011   TSH 2.09 11/28/2011       Assessment & Plan:   See problem list. Medications and labs reviewed today.

## 2013-02-12 ENCOUNTER — Other Ambulatory Visit: Payer: Self-pay | Admitting: Internal Medicine

## 2013-02-22 ENCOUNTER — Encounter: Payer: Self-pay | Admitting: Internal Medicine

## 2013-03-10 ENCOUNTER — Ambulatory Visit: Payer: Medicare Other | Admitting: Internal Medicine

## 2013-05-30 ENCOUNTER — Ambulatory Visit: Payer: Medicare Other | Admitting: Internal Medicine

## 2013-06-03 ENCOUNTER — Encounter: Payer: Self-pay | Admitting: Internal Medicine

## 2013-06-03 ENCOUNTER — Ambulatory Visit (INDEPENDENT_AMBULATORY_CARE_PROVIDER_SITE_OTHER): Payer: Medicare Other | Admitting: Internal Medicine

## 2013-06-03 ENCOUNTER — Other Ambulatory Visit (INDEPENDENT_AMBULATORY_CARE_PROVIDER_SITE_OTHER): Payer: Medicare Other

## 2013-06-03 VITALS — BP 142/84 | HR 78 | Temp 97.8°F | Wt 185.8 lb

## 2013-06-03 DIAGNOSIS — E785 Hyperlipidemia, unspecified: Secondary | ICD-10-CM

## 2013-06-03 DIAGNOSIS — R5381 Other malaise: Secondary | ICD-10-CM

## 2013-06-03 DIAGNOSIS — I10 Essential (primary) hypertension: Secondary | ICD-10-CM

## 2013-06-03 DIAGNOSIS — R5383 Other fatigue: Secondary | ICD-10-CM

## 2013-06-03 DIAGNOSIS — F329 Major depressive disorder, single episode, unspecified: Secondary | ICD-10-CM

## 2013-06-03 DIAGNOSIS — Z23 Encounter for immunization: Secondary | ICD-10-CM

## 2013-06-03 DIAGNOSIS — Z0000 Encounter for general adult medical examination without abnormal findings: Secondary | ICD-10-CM

## 2013-06-03 LAB — HEPATIC FUNCTION PANEL
AST: 28 U/L (ref 0–37)
Albumin: 4.3 g/dL (ref 3.5–5.2)
Alkaline Phosphatase: 87 U/L (ref 39–117)
Bilirubin, Direct: 0.1 mg/dL (ref 0.0–0.3)
Total Protein: 7.2 g/dL (ref 6.0–8.3)

## 2013-06-03 LAB — LIPID PANEL
LDL Cholesterol: 92 mg/dL (ref 0–99)
Total CHOL/HDL Ratio: 4
Triglycerides: 144 mg/dL (ref 0.0–149.0)

## 2013-06-03 LAB — CBC WITH DIFFERENTIAL/PLATELET
Basophils Relative: 1.2 % (ref 0.0–3.0)
Eosinophils Absolute: 0.4 10*3/uL (ref 0.0–0.7)
HCT: 42.9 % (ref 36.0–46.0)
Lymphs Abs: 1.7 10*3/uL (ref 0.7–4.0)
MCHC: 33.7 g/dL (ref 30.0–36.0)
MCV: 89.1 fl (ref 78.0–100.0)
Monocytes Absolute: 0.5 10*3/uL (ref 0.1–1.0)
Monocytes Relative: 13.9 % — ABNORMAL HIGH (ref 3.0–12.0)
Neutro Abs: 0.8 10*3/uL — ABNORMAL LOW (ref 1.4–7.7)
Neutrophils Relative %: 23.5 % — ABNORMAL LOW (ref 43.0–77.0)
RBC: 4.81 Mil/uL (ref 3.87–5.11)
WBC: 3.4 10*3/uL — ABNORMAL LOW (ref 4.5–10.5)

## 2013-06-03 LAB — BASIC METABOLIC PANEL
CO2: 28 mEq/L (ref 19–32)
Chloride: 101 mEq/L (ref 96–112)
Creatinine, Ser: 0.5 mg/dL (ref 0.4–1.2)
GFR: 124.67 mL/min (ref >60.00)

## 2013-06-03 MED ORDER — MONTELUKAST SODIUM 10 MG PO TABS: ORAL_TABLET | ORAL | Status: DC

## 2013-06-03 MED ORDER — SERTRALINE HCL 50 MG PO TABS: ORAL_TABLET | ORAL | Status: DC

## 2013-06-03 MED ORDER — BUSPIRONE HCL 15 MG PO TABS: ORAL_TABLET | ORAL | Status: DC

## 2013-06-03 MED ORDER — HYDROCHLOROTHIAZIDE 25 MG PO TABS: 25.0000 mg | ORAL_TABLET | Freq: Every day | ORAL | Status: DC

## 2013-06-03 MED ORDER — LOSARTAN POTASSIUM 100 MG PO TABS: ORAL_TABLET | ORAL | Status: DC

## 2013-06-03 MED ORDER — CELECOXIB 200 MG PO CAPS: 200.0000 mg | ORAL_CAPSULE | Freq: Every day | ORAL | Status: DC

## 2013-06-03 NOTE — Assessment & Plan Note (Signed)
symptoms stable at this time consider future change to generic cymbalta or venlafaxine if decline in symptom control, but not needed at this time

## 2013-06-03 NOTE — Assessment & Plan Note (Signed)
BP Readings from Last 3 Encounters:  06/03/13 142/84  02/10/13 142/82  11/28/12 138/82   started ARB 02/2012 -improved but not at goal Add HCTZ now - erx done

## 2013-06-03 NOTE — Patient Instructions (Addendum)
It was good to see you today.  Your annual flu shot was given and/or updated today.  Health Maintenance reviewed - all recommended immunizations and age-appropriate screenings are up-to-date.  Test(s) ordered today. Your results will be released to MyChart (or called to you) after review, usually within 72hours after test completion. If any changes need to be made, you will be notified at that same time.  Medications reviewed and updated.   Begin Celebrex once daily in place of Aleve for arthritis pain symptoms. Let us know if you need prescription sent to your pharmacy for this medication  Also begin HCTZ diuretic 25 mg once each morning to help blood pressure.  Your prescription(s)/refills have been submitted to your pharmacy. Please take as directed and contact our office if you believe you are having problem(s) with the medication(s).   Please schedule followup in 6  months, call sooner if problems.   Health Maintenance, Females A healthy lifestyle and preventative care can promote health and wellness.  Maintain regular health, dental, and eye exams.  Eat a healthy diet. Foods like vegetables, fruits, whole grains, low-fat dairy products, and lean protein foods contain the nutrients you need without too many calories. Decrease your intake of foods high in solid fats, added sugars, and salt. Get information about a proper diet from your caregiver, if necessary.  Regular physical exercise is one of the most important things you can do for your health. Most adults should get at least 150 minutes of moderate-intensity exercise (any activity that increases your heart rate and causes you to sweat) each week. In addition, most adults need muscle-strengthening exercises on 2 or more days a week.   Maintain a healthy weight. The body mass index (BMI) is a screening tool to identify possible weight problems. It provides an estimate of body fat based on height and weight. Your caregiver can  help determine your BMI, and can help you achieve or maintain a healthy weight. For adults 20 years and older:  A BMI below 18.5 is considered underweight.  A BMI of 18.5 to 24.9 is normal.  A BMI of 25 to 29.9 is considered overweight.  A BMI of 30 and above is considered obese.  Maintain normal blood lipids and cholesterol by exercising and minimizing your intake of saturated fat. Eat a balanced diet with plenty of fruits and vegetables. Blood tests for lipids and cholesterol should begin at age 36 and be repeated every 5 years. If your lipid or cholesterol levels are high, you are over 50, or you are a high risk for heart disease, you may need your cholesterol levels checked more frequently.Ongoing high lipid and cholesterol levels should be treated with medicines if diet and exercise are not effective.  If you smoke, find out from your caregiver how to quit. If you do not use tobacco, do not start.  If you are pregnant, do not drink alcohol. If you are breastfeeding, be very cautious about drinking alcohol. If you are not pregnant and choose to drink alcohol, do not exceed 1 drink per day. One drink is considered to be 12 ounces (355 mL) of beer, 5 ounces (148 mL) of wine, or 1.5 ounces (44 mL) of liquor.  Avoid use of street drugs. Do not share needles with anyone. Ask for help if you need support or instructions about stopping the use of drugs.  High blood pressure causes heart disease and increases the risk of stroke. Blood pressure should be checked at least every 1  to 2 years. Ongoing high blood pressure should be treated with medicines, if weight loss and exercise are not effective.  If you are 66 to 68 years old, ask your caregiver if you should take aspirin to prevent strokes.  Diabetes screening involves taking a blood sample to check your fasting blood sugar level. This should be done once every 3 years, after age 68, if you are within normal weight and without risk factors for  diabetes. Testing should be considered at a younger age or be carried out more frequently if you are overweight and have at least 1 risk factor for diabetes.  Breast cancer screening is essential preventative care for women. You should practice "breast self-awareness." This means understanding the normal appearance and feel of your breasts and may include breast self-examination. Any changes detected, no matter how small, should be reported to a caregiver. Women in their 55s and 30s should have a clinical breast exam (CBE) by a caregiver as part of a regular health exam every 1 to 3 years. After age 1, women should have a CBE every year. Starting at age 70, women should consider having a mammogram (breast X-ray) every year. Women who have a family history of breast cancer should talk to their caregiver about genetic screening. Women at a high risk of breast cancer should talk to their caregiver about having an MRI and a mammogram every year.  The Pap test is a screening test for cervical cancer. Women should have a Pap test starting at age 13. Between ages 69 and 30, Pap tests should be repeated every 2 years. Beginning at age 36, you should have a Pap test every 3 years as long as the past 3 Pap tests have been normal. If you had a hysterectomy for a problem that was not cancer or a condition that could lead to cancer, then you no longer need Pap tests. If you are between ages 44 and 46, and you have had normal Pap tests going back 10 years, you no longer need Pap tests. If you have had past treatment for cervical cancer or a condition that could lead to cancer, you need Pap tests and screening for cancer for at least 20 years after your treatment. If Pap tests have been discontinued, risk factors (such as a new sexual partner) need to be reassessed to determine if screening should be resumed. Some women have medical problems that increase the chance of getting cervical cancer. In these cases, your caregiver  may recommend more frequent screening and Pap tests.  The human papillomavirus (HPV) test is an additional test that may be used for cervical cancer screening. The HPV test looks for the virus that can cause the cell changes on the cervix. The cells collected during the Pap test can be tested for HPV. The HPV test could be used to screen women aged 37 years and older, and should be used in women of any age who have unclear Pap test results. After the age of 77, women should have HPV testing at the same frequency as a Pap test.  Colorectal cancer can be detected and often prevented. Most routine colorectal cancer screening begins at the age of 54 and continues through age 48. However, your caregiver may recommend screening at an earlier age if you have risk factors for colon cancer. On a yearly basis, your caregiver may provide home test kits to check for hidden blood in the stool. Use of a small camera at the end of  a tube, to directly examine the colon (sigmoidoscopy or colonoscopy), can detect the earliest forms of colorectal cancer. Talk to your caregiver about this at age 63, when routine screening begins. Direct examination of the colon should be repeated every 5 to 10 years through age 36, unless early forms of pre-cancerous polyps or small growths are found.  Hepatitis C blood testing is recommended for all people born from [birth year] through 1966 and any individual with known risks for hepatitis C.  Practice safe sex. Use condoms and avoid high-risk sexual practices to reduce the spread of sexually transmitted infections (STIs). Sexually active women aged 79 and younger should be checked for Chlamydia, which is a common sexually transmitted infection. Older women with new or multiple partners should also be tested for Chlamydia. Testing for other STIs is recommended if you are sexually active and at increased risk.  Osteoporosis is a disease in which the bones lose minerals and strength with aging. This  can result in serious bone fractures. The risk of osteoporosis can be identified using a bone density scan. Women ages 10 and over and women at risk for fractures or osteoporosis should discuss screening with their caregivers. Ask your caregiver whether you should be taking a calcium supplement or vitamin D to reduce the rate of osteoporosis.  Menopause can be associated with physical symptoms and risks. Hormone replacement therapy is available to decrease symptoms and risks. You should talk to your caregiver about whether hormone replacement therapy is right for you.  Use sunscreen with a sun protection factor (SPF) of 30 or greater. Apply sunscreen liberally and repeatedly throughout the day. You should seek shade when your shadow is shorter than you. Protect yourself by wearing long sleeves, pants, a wide-brimmed hat, and sunglasses year round, whenever you are outdoors.  Notify your caregiver of new moles or changes in moles, especially if there is a change in shape or color. Also notify your caregiver if a mole is larger than the size of a pencil eraser.  Stay current with your immunizations. Document Released: 02/20/2011 Document Revised: 10/30/2011 Document Reviewed: 02/20/2011 [facility] Patient Information 2014 [place], [place].

## 2013-06-03 NOTE — Progress Notes (Signed)
Subjective:    Patient ID: Wendy Yates, female    DOB: Jun 07, 1946, 67 y.o.   MRN: [id number]  HPI  Here for medicare wellness  Diet: heart healthy  Physical activity: sedentary Depression/mood screen: negative Hearing: intact to whispered voice Visual acuity: grossly normal, performs annual eye exam  ADLs: capable Fall risk: none Home safety: good Cognitive evaluation: intact to orientation, naming, recall and repetition EOL planning: adv directives, full code/ I agree  I have personally reviewed and have noted 1. The patient's medical and social history 2. Their use of alcohol, tobacco or illicit drugs 3. Their current medications and supplements 4. The patient's functional ability including ADL's, fall risks, home safety risks and hearing or visual impairment. 5. Diet and physical activities 6. Evidence for depression or mood disorders  Also reviewed chronic medical issues today:  Depression - increase in seasonal symptoms - phone notes reviewed - currently, denies SI/HI - taking sertraline and feels symptoms controlled, but ?future use cymbalta when generic? No irritability  Hypertension. History of same, started ARB 02/2102. Reports related to pain with exertion and weight gain. (ongoing weight gain since 2011 related to tobacco cessation)  Dyslipidemia. On atorvastatin since 2011. the patient reports compliance with medication(s) as prescribed. Denies adverse side effects.  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 Uses e-stim at home as needed since 2007. Reports balance and activity have improved since joining silver sneakers and 2011, works out 4 days per week - walks with cane assistance when out of the home, has electronic wheelchair for inside the home mobility.  Scoliosis, severe. Status post Harrington rod surgery in 1979. e-stim ongoing at home as needed for pain. Uses  Aleve for unrelieved pain. ?celebrex (has samples of same)   Past Medical History  Diagnosis Date  . Asthma, extrinsic   . Hypertension   . Osteopenia     DEXA 02/2012: -2.0; 12/19/07: -2.1; 08/17/04: -1.6  . Hyperlipidemia   . Post-polio syndrome     polio age 86,  RLE most affected  . Depression   . Anxiety   . Urine incontinence   . Allergic rhinitis, cause unspecified   . Scoliosis     1979 harrington rod surgery   Family History  Problem Relation Age of Onset  . Alcohol abuse Mother   . Arthritis Mother   . Arthritis Father   . Colon cancer Neg Hx    History  Substance Use Topics  . Smoking status: Former Smoker -- 0.50 packs/day for 10 years    Types: Cigarettes    Quit date: 08/21/2009  . Smokeless tobacco: Never Used  . Alcohol Use: Yes     Comment: maybe once a month per pt.    Review of Systems  Constitutional: Positive for fatigue and unexpected weight change (gain). Negative for fever.  Respiratory: Negative for cough and shortness of breath.   Gastrointestinal: Negative for nausea, vomiting, diarrhea, constipation and abdominal distention.  Endocrine: Negative for polydipsia, polyphagia and polyuria.  Genitourinary: Positive for frequency. Negative for dysuria and urgency.  Musculoskeletal: Positive for gait problem (chronic) and myalgias. Negative for joint swelling.  Psychiatric/Behavioral: Negative for suicidal ideas, sleep disturbance and dysphoric mood. The patient is not nervous/anxious.   No other specific complaints in a complete review of systems (except as listed in HPI above).      Objective:   Physical Exam BP 142/84  Pulse 78  Temp(Src) 97.8  F (36.6 C) (Oral)  Wt 185 lb 12.8 oz (84.278 kg)  BMI 36.29 kg/m2  SpO2 94% Wt Readings from Last 3 Encounters:  06/03/13 185 lb 12.8 oz (84.278 kg)  02/10/13 175 lb (79.379 kg)  11/28/12 173 lb (78.472 kg)   Constitutional: She is short stature, overweight, but appears well-developed and  well-nourished. No distress.   Cardiovascular: Normal rate, regular rhythm and normal heart sounds.  No murmur heard. No BLE edema. Pulmonary/Chest: Effort normal -breath sounds without wheeze. No respiratory distress.  Musculoskeletal: Significant rightward scoliosis. 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 Status post hammertoe repair bilaterally.  Neurological: She is alert and oriented to person, place, and time. No cranial nerve deficit. Coordination normal. Weak (3+/5) R hip flexors Psychiatric: She has a normal mood and affect. Her behavior is normal. Judgment and thought content normal.   Lab Results  Component Value Date   WBC 4.9 11/28/2011   HGB 15.0 11/28/2011   HCT 45.3 11/28/2011   PLT 280.0 11/28/2011   GLUCOSE 94 11/28/2011   CHOL 158 11/28/2012   TRIG 120.0 11/28/2012   HDL 40.80 11/28/2012   LDLCALC 93 11/28/2012   ALT 15 11/28/2011   AST 28 11/28/2011   NA 140 11/28/2011   K 3.8 11/28/2011   CL 101 11/28/2011   CREATININE 0.5 11/28/2011   BUN 12 11/28/2011   CO2 30 11/28/2011   TSH 2.09 11/28/2011       Assessment & Plan:   CPX/v70.0 - Today patient counseled on age appropriate routine health concerns for screening and prevention, each reviewed and up to date or declined. Immunizations reviewed and up to date or declined. Labs ordered and reviewed. Risk factors for depression reviewed and negative. Hearing function and visual acuity are intact. ADLs screened and addressed as needed. Functional ability and level of safety reviewed and appropriate. Education, counseling and referrals performed based on assessed risks today. Patient provided with a copy of personalized plan for preventive services.  Fatigue - nonspecific symptoms/exam - check screening labs  Also see problem list. Medications and labs reviewed today.

## 2013-06-03 NOTE — Progress Notes (Signed)
Pre-visit discussion using our clinic review tool. No additional management support is needed unless otherwise documented below in the visit note.  

## 2013-06-03 NOTE — Assessment & Plan Note (Addendum)
on Lipitor for same since 2011 Check lipids now and annually - titrate as needed

## 2013-06-07 ENCOUNTER — Encounter: Payer: Self-pay | Admitting: Internal Medicine

## 2013-06-09 MED ORDER — SERTRALINE HCL 50 MG PO TABS: 25.0000 mg | ORAL_TABLET | Freq: Every day | ORAL | Status: DC

## 2013-06-09 MED ORDER — VENLAFAXINE HCL ER 37.5 MG PO CP24: 37.5000 mg | ORAL_CAPSULE | Freq: Every day | ORAL | Status: DC

## 2013-06-23 ENCOUNTER — Encounter: Payer: Self-pay | Admitting: Internal Medicine

## 2013-06-30 MED ORDER — VENLAFAXINE HCL ER 37.5 MG PO CP24: 37.5000 mg | ORAL_CAPSULE | Freq: Every day | ORAL | Status: DC

## 2013-06-30 NOTE — Addendum Note (Signed)
Addended by: Rene Paci A on: 06/30/2013 01:06 PM   Modules accepted: Orders

## 2013-09-29 ENCOUNTER — Other Ambulatory Visit: Payer: Self-pay | Admitting: Internal Medicine

## 2013-12-02 ENCOUNTER — Ambulatory Visit (INDEPENDENT_AMBULATORY_CARE_PROVIDER_SITE_OTHER): Payer: Medicare Other | Admitting: Internal Medicine

## 2013-12-02 ENCOUNTER — Encounter: Payer: Self-pay | Admitting: Internal Medicine

## 2013-12-02 VITALS — BP 132/90 | HR 77 | Temp 97.5°F | Wt 187.1 lb

## 2013-12-02 DIAGNOSIS — F329 Major depressive disorder, single episode, unspecified: Secondary | ICD-10-CM

## 2013-12-02 DIAGNOSIS — I10 Essential (primary) hypertension: Secondary | ICD-10-CM

## 2013-12-02 DIAGNOSIS — G14 Postpolio syndrome: Secondary | ICD-10-CM

## 2013-12-02 DIAGNOSIS — B91 Sequelae of poliomyelitis: Secondary | ICD-10-CM

## 2013-12-02 DIAGNOSIS — J309 Allergic rhinitis, unspecified: Secondary | ICD-10-CM

## 2013-12-02 DIAGNOSIS — F3289 Other specified depressive episodes: Secondary | ICD-10-CM

## 2013-12-02 DIAGNOSIS — F32A Depression, unspecified: Secondary | ICD-10-CM

## 2013-12-02 MED ORDER — ATORVASTATIN CALCIUM 10 MG PO TABS: 10.0000 mg | ORAL_TABLET | Freq: Every day | ORAL | Status: DC

## 2013-12-02 MED ORDER — HYDROCHLOROTHIAZIDE 25 MG PO TABS: 25.0000 mg | ORAL_TABLET | Freq: Every day | ORAL | Status: DC

## 2013-12-02 MED ORDER — OMEPRAZOLE 20 MG PO CPDR: 20.0000 mg | DELAYED_RELEASE_CAPSULE | Freq: Every day | ORAL | Status: DC

## 2013-12-02 MED ORDER — BUSPIRONE HCL 15 MG PO TABS: ORAL_TABLET | ORAL | Status: DC

## 2013-12-02 MED ORDER — MONTELUKAST SODIUM 10 MG PO TABS: ORAL_TABLET | ORAL | Status: DC

## 2013-12-02 NOTE — Assessment & Plan Note (Signed)
Oral antihistamine and nasal steroid for seasonal flare

## 2013-12-02 NOTE — Progress Notes (Signed)
Pre visit review using our clinic review tool, if applicable. No additional management support is needed unless otherwise documented below in the visit note. 

## 2013-12-02 NOTE — Assessment & Plan Note (Signed)
associated with chronic fatigue but no progression of chronic RLE weakness Very functional - works out 4d/week with silver sneakers - ok to drive (DMV form completed 11/2012) Using E stim at home for pain relief and uses cane for ambulation assistance -  S/p HHPT 05/2012 for eval for best walker type to use as needed overlap of symptoms with scoliosis syndrome s/p eval by scoli specialist summer 2013 The current medical regimen is effective;  continue present plan and medications.

## 2013-12-02 NOTE — Progress Notes (Signed)
Subjective:    Patient ID: Wendy Yates, female    DOB: [birth year], 68 y.o.   MRN: [id number]  HPI  Patient is here for follow up  Reviewed chronic medical issues and interval medical events  Past Medical History  Diagnosis Date  . Asthma, extrinsic   . Hypertension   . Osteopenia     DEXA 02/2012: -2.0; 12/19/07: -2.1; 08/17/04: -1.6  . Hyperlipidemia   . Post-polio syndrome     polio age 89,  RLE most affected  . Depression   . Anxiety   . Urine incontinence   . Allergic rhinitis, cause unspecified   . Scoliosis     1979 harrington rod surgery    Review of Systems  Constitutional: Positive for fatigue (chronic). Negative for unexpected weight change.  Respiratory: Negative for cough and shortness of breath.   Cardiovascular: Negative for chest pain and palpitations.  Neurological: Negative for weakness (no changes).  Psychiatric/Behavioral: Positive for dysphoric mood (but improved with med change spring 2015).       Objective:   Physical Exam  BP 132/90  Pulse 77  Temp(Src) 97.5 F (36.4 C) (Oral)  Wt 187 lb 1.9 oz (84.877 kg)  SpO2 95% Wt Readings from Last 3 Encounters:  12/02/13 187 lb 1.9 oz (84.877 kg)  06/03/13 185 lb 12.8 oz (84.278 kg)  02/10/13 175 lb (79.379 kg)   Constitutional: She is short stature, overweight, but appears well-developed and well-nourished. No distress.   Cardiovascular: Normal rate, regular rhythm and normal heart sounds.  No murmur heard. No BLE edema. Pulmonary/Chest: Effort normal -breath sounds without wheeze. No respiratory distress.  Musculoskeletal: Significant rightward scoliosis. 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 Status post hammertoe repair bilaterally.  Neurological: She is alert and oriented to person, place, and time. No cranial nerve deficit. Coordination normal. Weak (3+/5) R hip flexors Psychiatric: She has a normal mood and affect. Her behavior is  normal. Judgment and thought content normal.   Lab Results  Component Value Date   WBC 3.4* 06/03/2013   HGB 14.4 06/03/2013   HCT 42.9 06/03/2013   PLT 314.0 06/03/2013   GLUCOSE 99 06/03/2013   CHOL 168 06/03/2013   TRIG 144.0 06/03/2013   HDL 46.80 06/03/2013   LDLCALC 92 06/03/2013   ALT 16 06/03/2013   AST 28 06/03/2013   NA 138 06/03/2013   K 4.1 06/03/2013   CL 101 06/03/2013   CREATININE 0.5 06/03/2013   BUN 15 06/03/2013   CO2 28 06/03/2013   TSH 2.69 06/03/2013    Dg Bone Density  03/12/2012   Lowest T-score -2.0 c/w mod osteopenia.  Consider continuation of  calcium/Vit D therapy, as well as weight bearing exercise, bisphosphonate  therapy, and follow up DXA at 25 months.      Assessment & Plan:   Problem List Items Addressed This Visit   Allergic rhinitis, cause unspecified - Primary     Oral antihistamine and nasal steroid for seasonal flare    Depression     Due to increasing symptoms, changed sertraline to SNRI 10/2013 - generally doing ok - may increase dose at future date if needed for symptoms  Refills today    Relevant Medications      busPIRone (BUSPAR) tablet   Hypertension      BP Readings from Last 3 Encounters:  12/02/13 132/90  06/03/13 142/84  02/10/13 142/82   started ARB 02/2012 -improved but not at goal so  added HCTZ 05/2013 - improved The current medical regimen is effective;  continue present plan and medications.      Relevant Medications      hydrochlorothiazide tablet      atorvastatin (LIPITOR) tablet   Post-polio syndrome     associated with chronic fatigue but no progression of chronic RLE weakness Very functional - works out 4d/week with silver sneakers - ok to drive (DMV form completed 11/2012) Using E stim at home for pain relief and uses cane for ambulation assistance -  S/p HHPT 05/2012 for eval for best walker type to use as needed overlap of symptoms with scoliosis syndrome s/p eval by scoli specialist summer  2013 The current medical regimen is effective;  continue present plan and medications.       Time spent with pt/PA-s today 25 minutes, greater than 50% time spent counseling patient on anxiety/depression symptoms, htn, post polio symptoms and medication review. Also review of prior records

## 2013-12-02 NOTE — Patient Instructions (Signed)
It was good to see you today.  We have reviewed your prior records including labs and tests today  Medications reviewed and updated, no changes recommended at this time. Refill on medication(s) as discussed today.  Please schedule followup in 6 months for annual review and labs, call sooner if problems.

## 2013-12-02 NOTE — Assessment & Plan Note (Signed)
BP Readings from Last 3 Encounters:  12/02/13 132/90  06/03/13 142/84  02/10/13 142/82   started ARB 02/2012 -improved but not at goal so added HCTZ 05/2013 - improved The current medical regimen is effective;  continue present plan and medications.

## 2013-12-02 NOTE — Assessment & Plan Note (Signed)
Due to increasing symptoms, changed sertraline to SNRI 10/2013 - generally doing ok - may increase dose at future date if needed for symptoms  Refills today

## 2013-12-02 NOTE — Progress Notes (Signed)
Wendy GoodellLouisa Yates [id number][birth year] 12/02/2013  Chief Complaint  Patient presents with  . Follow-up    6 months    Subjective  HPI  Patient presents for 6 mo f/u to assess effectiveness of venlafaxine for depression and anxiety sxs, for red, swollen itchy left eye X 1 day, concerns about weight gain, review of chronic medical issues, and for medication refills. Venlafaxine dose was lowered to 37.5mg  from 75mg . Says she is sleeping well, and that her depressive and anxiety sxs are stable in general. On Saturday, she started feeling unhappy about her weight, and did not enjoy doing the yardwork she was doing yesterday. Appetite is good. Denies worry about the future and denies SI/HI. Woke up with red, itchy, edematous eye this morning witheye matted shut. Thinks she was allergic to something in the yard yesterday, and that she brought it in her home too. Also noticed sneezing yesterday doing yardwork. Has postnasal drip. Takes zyrtec and alegra for her allergies, and states she has a runny nose when she discontinues them. Woke up this morning with SOB (has hx of asthma). Used inhaler, and SOB went away. Denies sore throat and watery eyes. Patient is most concerned about weight gain. She continues to gain weight even though she does silver sneakers [facility] and eats mainly vegetables. Is frustrated because she doesn't know what to do to get her weight down since she feels like she is doing all she can. Patient's hypertension is stable on current medication regimen.  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 Taking aleve, using e-stim, and laying down after exerting herself for long periods of times relieves pain.     Past Medical History  Diagnosis Date  . Asthma, extrinsic   . Hypertension   . Osteopenia     DEXA 02/2012: -2.0; 12/19/07: -2.1; 08/17/04: -1.6  . Hyperlipidemia   . Post-polio syndrome     polio age 89,  RLE most affected  . Depression   .  Anxiety   . Urine incontinence   . Allergic rhinitis, cause unspecified   . Scoliosis     1979 harrington rod surgery    Past Surgical History  Procedure Laterality Date  . Multiple surgeries to allow her to walk post polio    . Back surgery  1979    harrington rod inserted  . Left hammertoe repair    . Tubal ligation    . Cholecystectomy  1969  . Tonsillectomy  1952  . Breast biopsy      Family History  Problem Relation Age of Onset  . Alcohol abuse Mother   . Arthritis Mother   . Arthritis Father   . Colon cancer Neg Hx     History  Substance Use Topics  . Smoking status: Former Smoker -- 0.50 packs/day for 10 years    Types: Cigarettes    Quit date: 08/21/2009  . Smokeless tobacco: Never Used  . Alcohol Use: Yes     Comment: maybe once a month per pt.    Current Outpatient Prescriptions on File Prior to Visit  Medication Sig Dispense Refill  . albuterol (PROVENTIL HFA;VENTOLIN HFA) 108 (90 BASE) MCG/ACT inhaler Inhale 2 puffs into the lungs every 6 (six) hours as needed for wheezing.  1 Inhaler  2  . albuterol (PROVENTIL) (2.5 MG/3ML) 0.083% nebulizer solution Take 3 mLs (2.5 mg total) by nebulization 4 (four) times daily as needed.  75 mL  0  . Calcium  Carbonate-Vitamin D (CALTRATE 600+D) 600-400 MG-UNIT per tablet Take 1 tablet by mouth 2 (two) times daily.      . celecoxib (CELEBREX) 200 MG capsule Take 1 capsule (200 mg total) by mouth daily.      . cholecalciferol (VITAMIN D) 1000 UNITS tablet Take 1,000 Units by mouth daily.      . Cyanocobalamin (B-12) 2500 MCG TABS Take by mouth daily.      . fexofenadine (ALLEGRA) 180 MG tablet Take 1 tablet (180 mg total) by mouth daily.      . Fluticasone-Salmeterol (ADVAIR DISKUS) 250-50 MCG/DOSE AEPB Inhale 1 puff into the lungs 2 (two) times daily.  1 each  3  . losartan (COZAAR) 100 MG tablet TAKE 1 TABLET BY MOUTH DAILY  90 tablet  1  . Multiple Vitamin (DAILY MULTIVITAMIN PO) Take by mouth daily.      . naproxen  sodium (ALEVE) 220 MG tablet Take 220 mg by mouth as needed.      . sertraline (ZOLOFT) 50 MG tablet Take 0.5 tablets (25 mg total) by mouth daily. X 1 week, then stop  90 tablet  1  . venlafaxine XR (EFFEXOR XR) 37.5 MG 24 hr capsule Take 1 capsule (37.5 mg total) by mouth daily.  30 capsule  1   No current facility-administered medications on file prior to visit.     Allergies: Allergies  Allergen Reactions  . Morphine And Related Other (See Comments)    "crazy"  . Sulfa Antibiotics Swelling and Other (See Comments)    "swollen joints and extreme pain"    Review of Systems  Constitutional: Negative for fever.  HENT: Negative for sore throat.   Eyes: Positive for redness. Negative for blurred vision, pain and discharge.  Respiratory: Positive for shortness of breath. Negative for cough and wheezing.   Cardiovascular: Negative for chest pain.  Gastrointestinal: Negative for abdominal pain.       Denies bowel changes.  Genitourinary:       Denies urinary changes.  Musculoskeletal:       Shooting pains in RLE.  Skin: Positive for itching.       Objective  Filed Vitals:   12/02/13 0905  BP: 132/90  Pulse: 77  Temp: 97.5 F (36.4 C)  TempSrc: Oral  Weight: 187 lb 1.9 oz (84.877 kg)  SpO2: 95%    Physical Exam Constitutional: Patient in NAD sitting in the exam room. Patient is obese, well-developed and well-nourished. HENT: No exudate or erythema in throat. No lymphadenopathy. Eyes: Erythematous, edematous skin around eye. Slight redness in eye. No vision changes. PERRL.  Heart: RRR. No murmur or edema. Lungs: Lungs clear on auscultation. No increased work of breathing. No wheezing. No clubbing or cyanosis.    BP Readings from Last 3 Encounters:  12/02/13 132/90  06/03/13 142/84  02/10/13 142/82    Wt Readings from Last 3 Encounters:  12/02/13 187 lb 1.9 oz (84.877 kg)  06/03/13 185 lb 12.8 oz (84.278 kg)  02/10/13 175 lb (79.379 kg)    Lab Results    Component Value Date   WBC 3.4* 06/03/2013   HGB 14.4 06/03/2013   HCT 42.9 06/03/2013   PLT 314.0 06/03/2013   GLUCOSE 99 06/03/2013   CHOL 168 06/03/2013   TRIG 144.0 06/03/2013   HDL 46.80 06/03/2013   LDLCALC 92 06/03/2013   ALT 16 06/03/2013   AST 28 06/03/2013   NA 138 06/03/2013   K 4.1 06/03/2013   CL 101 06/03/2013  CREATININE 0.5 06/03/2013   BUN 15 06/03/2013   CO2 28 06/03/2013   TSH 2.69 06/03/2013    Dg Bone Density  03/12/2012   Lowest T-score -2.0 c/w mod osteopenia.  Consider continuation of  calcium/Vit D therapy, as well as weight bearing exercise, bisphosphonate  therapy, and follow up DXA at 25 months.      Assessment and Plan  Hypertension (401.9) Stable on current medication regimen.  Depression (311): Depressive sxs stable on venlafaxine. Saturday noticed feelings of unhappiness and low self worth due to weight gain. Discussed potential increase in dosage of venlafaxine back up to 37.5mg  if sxs continue. Anxiety (300.0): Sxs of anxiety are stable on venlafaxine. States she dreams about her dog being at the vet since he is 60 years old and getting old.  Contact Dermatitis (692.9) Was working in yard yesterday and woke up with erythema, edema, and pruritis around eye. Thinks eye issue related to being allergic to something outdoors. Erythematous, edematous skin around left eye.  Obesity (278) Patient is gaining weight yet is working out 4X a week and eating healthy. Up 2 lbs since last visit 6 months ago. Informed about [employer] program where patient can monitor calories and exercise for weight loss. Post-polio syndrome (138) Patient presents with occasional shooting pains in RLE. Takes aleve for pain, uses e-stim and lays down when her leg bothers her.   Refill medications.  Follow up in 6 months to review chronic medical issues and do labs.   Larwance Rote, Student-PA   I have personally reviewed this case with PA student. I also  personally examined this patient. I agree with history and findings as documented above. I reviewed, discussed and approve of the assessment and plan as listed above. Newt Lukes, MD

## 2013-12-03 ENCOUNTER — Telehealth: Payer: Self-pay | Admitting: Internal Medicine

## 2013-12-03 NOTE — Telephone Encounter (Signed)
Relevant patient education assigned to patient using Emmi. ° °

## 2014-01-19 ENCOUNTER — Encounter: Payer: Self-pay | Admitting: Internal Medicine

## 2014-01-19 ENCOUNTER — Other Ambulatory Visit (INDEPENDENT_AMBULATORY_CARE_PROVIDER_SITE_OTHER): Payer: Medicare Other

## 2014-01-19 ENCOUNTER — Ambulatory Visit (INDEPENDENT_AMBULATORY_CARE_PROVIDER_SITE_OTHER): Payer: Medicare Other | Admitting: Internal Medicine

## 2014-01-19 ENCOUNTER — Ambulatory Visit (INDEPENDENT_AMBULATORY_CARE_PROVIDER_SITE_OTHER)
Admission: RE | Admit: 2014-01-19 | Discharge: 2014-01-19 | Disposition: A | Discharge status: Home / Self Care | Payer: Medicare Other | Source: Ambulatory Visit | Attending: Internal Medicine | Admitting: Internal Medicine

## 2014-01-19 VITALS — BP 114/66 | HR 99 | Temp 98.1°F | Wt 184.8 lb

## 2014-01-19 DIAGNOSIS — J45901 Unspecified asthma with (acute) exacerbation: Principal | ICD-10-CM

## 2014-01-19 DIAGNOSIS — Z9119 Patient's noncompliance with other medical treatment and regimen: Secondary | ICD-10-CM

## 2014-01-19 DIAGNOSIS — J441 Chronic obstructive pulmonary disease with (acute) exacerbation: Secondary | ICD-10-CM

## 2014-01-19 DIAGNOSIS — Z91199 Patient's noncompliance with other medical treatment and regimen due to unspecified reason: Secondary | ICD-10-CM

## 2014-01-19 DIAGNOSIS — Z9114 Patient's other noncompliance with medication regimen: Secondary | ICD-10-CM

## 2014-01-19 LAB — CBC WITH DIFFERENTIAL/PLATELET
BASOS ABS: 0.1 10*3/uL (ref 0.0–0.1)
Basophils Relative: 1.7 % (ref 0.0–3.0)
EOS ABS: 0.6 10*3/uL (ref 0.0–0.7)
Eosinophils Relative: 11.5 % — ABNORMAL HIGH (ref 0.0–5.0)
HCT: 43.8 % (ref 36.0–46.0)
Hemoglobin: 14.7 g/dL (ref 12.0–15.0)
Lymphocytes Relative: 33.3 % (ref 12.0–46.0)
Lymphs Abs: 1.8 10*3/uL (ref 0.7–4.0)
MCHC: 33.7 g/dL (ref 30.0–36.0)
MCV: 92.2 fl (ref 78.0–100.0)
MONO ABS: 0.6 10*3/uL (ref 0.1–1.0)
Monocytes Relative: 11.8 % (ref 3.0–12.0)
Neutro Abs: 2.3 10*3/uL (ref 1.4–7.7)
Neutrophils Relative %: 41.7 % — ABNORMAL LOW (ref 43.0–77.0)
Platelets: 310 10*3/uL (ref 150.0–400.0)
RBC: 4.75 Mil/uL (ref 3.87–5.11)
RDW: 13.6 % (ref 11.5–15.5)
WBC: 5.4 10*3/uL (ref 4.0–10.5)

## 2014-01-19 MED ORDER — PREDNISONE 20 MG PO TABS: 20.0000 mg | ORAL_TABLET | Freq: Two times a day (BID) | ORAL | Status: DC

## 2014-01-19 NOTE — Patient Instructions (Signed)
Albuterol is a rescue inhaler which should be used as infrequently as possible; it should never be used more than 1-2 puffs every 4 hours. It may  be used 15-30 minutes before exercise if that is also  a trigger for your asthma.   If it is required more than 2-3 times per week; the asthma is not well controlled. Symbicort , Dulera , Advair or other maintenance agent should be considered to control smooth muscle spasm and airway inflammation  and to prevent adverse effects from excess albuterol use.Those adverse effects can include health or life threatening heart rhythm irregularities.   Used the 3 samples of Advair serially. One inhalation every 12 hours; gargle and spit after use. The first week use 500/50; the second week use the 250/50 and finally the third week use of 100/50.

## 2014-01-19 NOTE — Progress Notes (Signed)
   Subjective:    Patient ID: Wendy Yates, female    DOB: 1946/02/04, 69 y.o.   MRN: [id number]  HPI  She's had an exacerbation of her asthma over the last 2 weeks.  The original trigger were unspecified outdoor and indoor allergens. She's actually had symptoms for several months but this has become much worse. She is unable to identify the specific trigger  Her asthma flared 5/29-5/31. She used her ProAir or nebulized albuterol up to 12-15 times per day with no benefit. After nebulized treatment she will bring up white foamy sputum with scant yellow plugs.  She has continued to use her Singulair. She has also been employing Allegra, Zyrtec, and Nasacort. She was unable to afford the Advair as a prescription was to cost her $240.  She's had asthma since age 30. 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      Review of Systems  She describes profound malaise. She's had sweats and chills but no definite fever  She also has had some otic pain without discharge  She describes some facial pain as well.   She denies frontal headache or significant nasal purulence.  She has had some neck pain the present illness. Aleve has not been of benefit.     Objective:   Physical Exam She appears tired but in no acute distress.  Nasal septum is deviated to the right.  She has decreased breath sounds generally. There is isolated wheezing in posterior chest.  S4  without significant murmur  There is profound asymmetry of the posterior thoracic muscles suggesting scoliosis. There is evidence of prior back surgery.  Slight clubbing of the nailbeds suggested. No cyanosis present. No edema  General appearance:adequately nourished; no acute distress or increased work of breathing is present.  No  lymphadenopathy about the head, neck, or axilla noted.   Eyes: No conjunctival inflammation or lid edema is  present. There is no scleral icterus.  Ears:  External ear exam shows no significant lesions or deformities.  Otoscopic examination reveals clear canals, tympanic membranes are intact bilaterally without bulging, retraction, inflammation or discharge.  Nose:  External nasal examination shows no deformity or inflammation. Nasal mucosa are pink and moist without lesions or exudates.     Oral exam: Dental hygiene is good; lips and gums are healthy appearing.There is no oropharyngeal erythema or exudate noted.   Neck:  No deformities, thyromegaly, masses, or tenderness noted.   Supple with full range of motion without pain.   Heart:  Normal rate and regular rhythm. S1 and S2 normal without gallop, click, or  rub .    Skin: Warm & dry w/o jaundice or tenting.         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

## 2014-01-19 NOTE — Progress Notes (Signed)
Pre visit review using our clinic review tool, if applicable. No additional management support is needed unless otherwise documented below in the visit note. 

## 2014-02-16 ENCOUNTER — Other Ambulatory Visit: Payer: Self-pay | Admitting: Internal Medicine

## 2014-05-28 ENCOUNTER — Encounter: Payer: Self-pay | Admitting: Internal Medicine

## 2014-05-31 NOTE — Telephone Encounter (Signed)
Please see if there is anywhere to help work Ms Ramnauth into my schedule - and if no, work in with Dr Dorise HissKollar Thanks!

## 2014-06-01 NOTE — Telephone Encounter (Signed)
Called pt and lvm.    RE: can schedule her to see Dorise HissKollar possibly this week or early next week.

## 2014-06-09 ENCOUNTER — Encounter: Payer: Medicare Other | Admitting: Internal Medicine

## 2014-06-24 ENCOUNTER — Ambulatory Visit (INDEPENDENT_AMBULATORY_CARE_PROVIDER_SITE_OTHER): Payer: Medicare Other | Admitting: Family

## 2014-06-24 ENCOUNTER — Other Ambulatory Visit (INDEPENDENT_AMBULATORY_CARE_PROVIDER_SITE_OTHER): Payer: Medicare Other

## 2014-06-24 ENCOUNTER — Ambulatory Visit (INDEPENDENT_AMBULATORY_CARE_PROVIDER_SITE_OTHER): Payer: Medicare Other

## 2014-06-24 ENCOUNTER — Encounter: Payer: Self-pay | Admitting: Family

## 2014-06-24 ENCOUNTER — Encounter: Payer: Medicare Other | Admitting: Internal Medicine

## 2014-06-24 VITALS — BP 180/112 | HR 96 | Temp 97.6°F | Resp 18 | Ht 60.0 in | Wt 190.4 lb

## 2014-06-24 DIAGNOSIS — I10 Essential (primary) hypertension: Secondary | ICD-10-CM

## 2014-06-24 DIAGNOSIS — Z1231 Encounter for screening mammogram for malignant neoplasm of breast: Secondary | ICD-10-CM

## 2014-06-24 DIAGNOSIS — E785 Hyperlipidemia, unspecified: Secondary | ICD-10-CM

## 2014-06-24 DIAGNOSIS — Z0000 Encounter for general adult medical examination without abnormal findings: Secondary | ICD-10-CM | POA: Insufficient documentation

## 2014-06-24 DIAGNOSIS — Z23 Encounter for immunization: Secondary | ICD-10-CM

## 2014-06-24 DIAGNOSIS — M542 Cervicalgia: Secondary | ICD-10-CM

## 2014-06-24 LAB — BASIC METABOLIC PANEL
BUN: 15 mg/dL (ref 6–23)
CHLORIDE: 98 meq/L (ref 96–112)
CO2: 24 mEq/L (ref 19–32)
Calcium: 10.3 mg/dL (ref 8.4–10.5)
Creatinine, Ser: 0.6 mg/dL (ref 0.4–1.2)
GFR: 99.59 mL/min (ref >60.00)
Glucose, Bld: 95 mg/dL (ref 70–99)
POTASSIUM: 3.6 meq/L (ref 3.5–5.1)
Sodium: 136 mEq/L (ref 135–145)

## 2014-06-24 LAB — HEPATIC FUNCTION PANEL
ALBUMIN: 3.9 g/dL (ref 3.5–5.2)
ALT: 18 U/L (ref 0–35)
AST: 34 U/L (ref 0–37)
Alkaline Phosphatase: 84 U/L (ref 39–117)
Bilirubin, Direct: 0.1 mg/dL (ref 0.0–0.3)
TOTAL PROTEIN: 7.5 g/dL (ref 6.0–8.3)
Total Bilirubin: 0.9 mg/dL (ref 0.2–1.2)

## 2014-06-24 LAB — LIPID PANEL
Cholesterol: 175 mg/dL (ref 0–200)
HDL: 41 mg/dL (ref >39.00)
LDL CALC: 109 mg/dL — AB (ref 0–99)
NonHDL: 134
TRIGLYCERIDES: 124 mg/dL (ref 0.0–149.0)
Total CHOL/HDL Ratio: 4
VLDL: 24.8 mg/dL (ref 0.0–40.0)

## 2014-06-24 LAB — CBC
HCT: 45.3 % (ref 36.0–46.0)
Hemoglobin: 14.9 g/dL (ref 12.0–15.0)
MCHC: 32.8 g/dL (ref 30.0–36.0)
MCV: 91.8 fl (ref 78.0–100.0)
PLATELETS: 343 10*3/uL (ref 150.0–400.0)
RBC: 4.94 Mil/uL (ref 3.87–5.11)
RDW: 13.2 % (ref 11.5–15.5)
WBC: 5 10*3/uL (ref 4.0–10.5)

## 2014-06-24 NOTE — Assessment & Plan Note (Signed)
Currently maintained on atorvastatin with no adverse side effects. Continue atorvastatin at current dose and obtain lipid panel and hepatic panel. Changes pending results of lab work.

## 2014-06-24 NOTE — Progress Notes (Signed)
Pre visit review using our clinic review tool, if applicable. No additional management support is needed unless otherwise documented below in the visit note. 

## 2014-06-24 NOTE — Assessment & Plan Note (Signed)
Blood pressure remains greater than goal. Current reading appears to be an outlier. Maintain current HCTZ and losartan. Recheck at next follow up. If pressure remain high consider adding a new agent. Check BMET and CBC.

## 2014-06-24 NOTE — Patient Instructions (Signed)
Thank you for choosing [facility].  Summary/Instructions:  Please try using Thermacare as needed  Please try moist heat 2-3 times per day for 20 minutes and stretch your neck in all directions.  You may try icy/hot or aspercream.

## 2014-06-24 NOTE — Addendum Note (Signed)
Addended by: Jeanine LuzALONE, Mandeep Ferch D on: 06/24/2014 08:54 PM   Modules accepted: Level of Service

## 2014-06-24 NOTE — Progress Notes (Signed)
Subjective:   Patient ID: Wendy Yates, female    DOB: [birth year], 68 y.o.   MRN: [id number]  Chief Complaint  Patient presents with  . CPE    Fasting, she wants basic blood work and has a pain in back of neck that she has been trying to get seen for for 4 months    Euna Armon is a 68 y.o. female who presents for Medicare Annual/Subsequent preventive examination.  1) Hypertension - currently maintained on HCTZ and Cozaar. Denies any chest pain/discomfort, shortness of breath, edema, cough, or palpitations.  BP Readings from Last 3 Encounters:  06/24/14 180/112  01/19/14 114/66  12/02/13 132/90   2) Hyperlipidemia - currently maintained on atorvastatin. Denies any muscle pain or other adverse symptoms.  Lab Results  Component Value Date   CHOL 175 06/24/2014   HDL 41.00 06/24/2014   LDLCALC 109* 06/24/2014   TRIG 124.0 06/24/2014   CHOLHDL 4 06/24/2014    Preventive Screening-Counseling & Management  Tobacco History  Smoking status  . Former Smoker -- 0.50 packs/day for 10 years  . Types: Cigarettes  . Quit date: 08/21/2009  Smokeless tobacco  . Never Used     Problems Prior to Visit 1.   Current Problems (verified) Patient Active Problem List   Diagnosis Date Noted  . Dyslipidemia 11/28/2011  . Hypertension   . Asthma, extrinsic   . Osteopenia   . Post-polio syndrome   . Depression   . Anxiety   . Allergic rhinitis, cause unspecified   . Scoliosis    Medications Prior to Visit Current Outpatient Prescriptions on File Prior to Visit  Medication Sig Dispense Refill  . albuterol (PROVENTIL) (2.5 MG/3ML) 0.083% nebulizer solution Take 3 mLs (2.5 mg total) by nebulization 4 (four) times daily as needed. 75 mL 0  . atorvastatin (LIPITOR) 10 MG tablet Take 1 tablet (10 mg total) by mouth daily. 90 tablet 1  . busPIRone (BUSPAR) 15 MG tablet TAKE 1 TABLET BY MOUTH TWICE DAILY 180 tablet 1  . Calcium Carbonate-Vitamin D (CALTRATE 600+D) 600-400 MG-UNIT per  tablet Take 1 tablet by mouth 2 (two) times daily.    . celecoxib (CELEBREX) 200 MG capsule Take 1 capsule (200 mg total) by mouth daily.    . cholecalciferol (VITAMIN D) 1000 UNITS tablet Take 1,000 Units by mouth daily.    . Cyanocobalamin (B-12) 2500 MCG TABS Take by mouth daily.    . fexofenadine (ALLEGRA) 180 MG tablet Take 1 tablet (180 mg total) by mouth daily.    . Fluticasone-Salmeterol (ADVAIR DISKUS) 250-50 MCG/DOSE AEPB Inhale 1 puff into the lungs 2 (two) times daily. 1 each 3  . hydrochlorothiazide (HYDRODIURIL) 25 MG tablet Take 1 tablet (25 mg total) by mouth daily. 90 tablet 1  . losartan (COZAAR) 100 MG tablet TAKE 1 TABLET BY MOUTH EVERY DAY 90 tablet 1  . montelukast (SINGULAIR) 10 MG tablet TAKE 1 TABLET BY MOUTH EVERY NIGHT AT BEDTIME 90 tablet 1  . Multiple Vitamin (DAILY MULTIVITAMIN PO) Take by mouth daily.    . naproxen sodium (ALEVE) 220 MG tablet Take 220 mg by mouth as needed.    Marland Kitchen omeprazole (PRILOSEC) 20 MG capsule Take 1 capsule (20 mg total) by mouth daily. 90 capsule 1  . predniSONE (DELTASONE) 20 MG tablet Take 1 tablet (20 mg total) by mouth 2 (two) times daily. 14 tablet 0  . venlafaxine XR (EFFEXOR XR) 37.5 MG 24 hr capsule Take 1 capsule (37.5 mg total) by mouth  daily. 30 capsule 1  . albuterol (PROVENTIL HFA;VENTOLIN HFA) 108 (90 BASE) MCG/ACT inhaler Inhale 2 puffs into the lungs every 6 (six) hours as needed for wheezing. 1 Inhaler 2  . sertraline (ZOLOFT) 50 MG tablet Take 0.5 tablets (25 mg total) by mouth daily. X 1 week, then stop 90 tablet 1   No current facility-administered medications on file prior to visit.    Current Medications (verified) Current Outpatient Prescriptions  Medication Sig Dispense Refill  . albuterol (PROVENTIL) (2.5 MG/3ML) 0.083% nebulizer solution Take 3 mLs (2.5 mg total) by nebulization 4 (four) times daily as needed. 75 mL 0  . atorvastatin (LIPITOR) 10 MG tablet Take 1 tablet (10 mg total) by mouth daily. 90 tablet 1    . busPIRone (BUSPAR) 15 MG tablet TAKE 1 TABLET BY MOUTH TWICE DAILY 180 tablet 1  . Calcium Carbonate-Vitamin D (CALTRATE 600+D) 600-400 MG-UNIT per tablet Take 1 tablet by mouth 2 (two) times daily.    . celecoxib (CELEBREX) 200 MG capsule Take 1 capsule (200 mg total) by mouth daily.    . cholecalciferol (VITAMIN D) 1000 UNITS tablet Take 1,000 Units by mouth daily.    . Cyanocobalamin (B-12) 2500 MCG TABS Take by mouth daily.    . fexofenadine (ALLEGRA) 180 MG tablet Take 1 tablet (180 mg total) by mouth daily.    . Fluticasone-Salmeterol (ADVAIR DISKUS) 250-50 MCG/DOSE AEPB Inhale 1 puff into the lungs 2 (two) times daily. 1 each 3  . hydrochlorothiazide (HYDRODIURIL) 25 MG tablet Take 1 tablet (25 mg total) by mouth daily. 90 tablet 1  . losartan (COZAAR) 100 MG tablet TAKE 1 TABLET BY MOUTH EVERY DAY 90 tablet 1  . montelukast (SINGULAIR) 10 MG tablet TAKE 1 TABLET BY MOUTH EVERY NIGHT AT BEDTIME 90 tablet 1  . Multiple Vitamin (DAILY MULTIVITAMIN PO) Take by mouth daily.    . naproxen sodium (ALEVE) 220 MG tablet Take 220 mg by mouth as needed.    Marland Kitchen. omeprazole (PRILOSEC) 20 MG capsule Take 1 capsule (20 mg total) by mouth daily. 90 capsule 1  . predniSONE (DELTASONE) 20 MG tablet Take 1 tablet (20 mg total) by mouth 2 (two) times daily. 14 tablet 0  . venlafaxine XR (EFFEXOR XR) 37.5 MG 24 hr capsule Take 1 capsule (37.5 mg total) by mouth daily. 30 capsule 1  . albuterol (PROVENTIL HFA;VENTOLIN HFA) 108 (90 BASE) MCG/ACT inhaler Inhale 2 puffs into the lungs every 6 (six) hours as needed for wheezing. 1 Inhaler 2  . sertraline (ZOLOFT) 50 MG tablet Take 0.5 tablets (25 mg total) by mouth daily. X 1 week, then stop 90 tablet 1   No current facility-administered medications for this visit.     Allergies (verified) Morphine and related and Sulfa antibiotics   PAST HISTORY  Family History Family History  Problem Relation Age of Onset  . Alcohol abuse Mother   . Arthritis Mother    . Arthritis Father   . Colon cancer Neg Hx     Social History History  Substance Use Topics  . Smoking status: Former Smoker -- 0.50 packs/day for 10 years    Types: Cigarettes    Quit date: 08/21/2009  . Smokeless tobacco: Never Used  . Alcohol Use: Yes     Comment: maybe once a month per pt.     Are there smokers in your home (other than you)? No  Risk Factors [facility] [facility] [employer] [facility]. Walk the dog  daily about 1/4 mile.  Dietary issues discussed:   Cardiac risk factors:  Hypertension, hyperlipidemia, obesity. Depression Screen (Note: if answer to either of the following is "Yes", a more complete depression screening is indicated)   Over the past two weeks, have you felt down, depressed or hopeless?  YES  Over the past two weeks, have you felt little interest or pleasure in doing things? No  Have you lost interest or pleasure in daily life? No  Do you often feel hopeless? No  Do you cry easily over simple problems? No  Activities of Daily Living In your present state of health, do you have any difficulty performing the following activities?:  Driving? No Managing money?  No Feeding yourself? No Getting from bed to chair? No Climbing a flight of stairs? YES - uses cane Preparing food and eating?:No Bathing or showering? No Getting dressed: No Getting to the toilet? No Using the toilet: No Moving around from place to place: No In the past year have you fallen or had a near fall?: No  Are you sexually active? Yes  Do you have more than one partner? No  Hearing Difficulties: No Do you often ask people to speak up or repeat themselves? No Do you experience ringing or noises in your ears? YES Do you have difficulty understanding soft or whispered voices? No  Do you feel that you have a problem with memory? No  Do you often misplace items? YES  Cognitive Testing  Alert? Yes  Normal Appearance? Yes  Oriented to person? Yes   Place? Yes  Time? Yes  Recall of three objects?  Yes  Can perform simple calculations? Yes  Displays appropriate judgment? Yes  Can read the correct time from a watch face? Yes  Advanced Directives have been discussed with the patient? Has a living will, power of attorney and healthcare power of attorney  List the Names of Other Physician/Practitioners you currently use: 1.  Dr. Noel Gerold - Scoliosis specialist  Indicate any recent Medical Services you may have received from other than [facility] providers in the past year (date may be approximate).  Immunization History  Administered Date(s) Administered  . Influenza Split 05/28/2012  . Influenza, High Dose Seasonal PF 06/03/2013  . Influenza,inj,Quad PF,36+ Mos 06/24/2014  . Pneumococcal Polysaccharide-23 08/22/2003, 11/28/2011  . Td 08/21/2004  Prevnar  Screening Tests Health Maintenance  Topic Date Due  . ZOSTAVAX  07/03/2005  . MAMMOGRAM  12/18/2013  . INFLUENZA VACCINE  03/21/2014  . TETANUS/TDAP  07/26/2015  . COLONOSCOPY  04/01/2022  . PNEUMOCOCCAL POLYSACCHARIDE VACCINE AGE 61 AND OVER  Completed    All answers were reviewed with the patient and necessary referrals were made:  Jeanine Luz, FNP   06/24/2014    Review of Systems  Constitutional: Denies fever, chills, fatigue, or significant weight gain/loss. HENT: Head: Denies headache or neck pain Ears: Denies changes in hearing, ringing in ears, earache, drainage Nose: Denies discharge, stuffiness, itching, nosebleed, sinus pain Throat: Denies sore throat, hoarseness, dry mouth, sores, thrush Eyes: Denies loss/changes in vision, pain, redness, blurry/double vision, flashing lights Cardiovascular: Denies chest pain/discomfort, tightness, palpitations, shortness of breath with activity, difficulty lying down, swelling, sudden awakening with shortness of breath Respiratory: Denies shortness of breath, cough, sputum production, wheezing Gastrointestinal: Denies  dysphasia, heartburn, change in appetite, nausea, change in bowel habits, rectal bleeding, constipation, diarrhea, yellow skin or eyes Genitourinary: Denies frequency, urgency, burning/pain, blood in urine, incontinence, change in urinary strength. Musculoskeletal: Denies muscle/joint pain (except noted  below), stiffness, back pain, redness or swelling of joints, trauma Neck and head problem - has been going since June. Feels like a baseball hit her in the neck and it is deep. Pain is located from the occiput down the pathway down the right trapezius. Previously prescribed prednisone for asthma helped with the neck pain. Since stopping the prednisione the pain has increased. Denies any numbness or tingling in her hands.  Skin: Denies rashes, lumps, itching, dryness, color changes, or hair/nail changes Neurological: Denies dizziness, fainting, seizures, weakness, numbness, tingling, tremor Psychiatric - Denies nervousness, stress, depression or memory loss Endocrine: Denies heat or cold intolerance, sweating, frequent urination, excessive thirst, changes in appetite Hematologic: Denies ease of bruising or bleeding  Objective:     Body mass index is 37.19 kg/(m^2). BP 180/112 mmHg  Pulse 96  Temp(Src) 97.6 F (36.4 C) (Oral)  Resp 18  Ht 5' (1.524 m)  Wt 190 lb 6.4 oz (86.365 kg)  BMI 37.19 kg/m2  SpO2 94%  Physical Exam  Constitutional: She is oriented to person, place, and time.  HENT:  Head: Normocephalic.  Right Ear: Hearing, tympanic membrane, external ear and ear canal normal.  Left Ear: Hearing, tympanic membrane, external ear and ear canal normal.  Nose: Nose normal.  Mouth/Throat: Uvula is midline, oropharynx is clear and moist and mucous membranes are normal.  Increased cerumen noted in right ear  Eyes: Conjunctivae and EOM are normal. Pupils are equal, round, and reactive to light.  Neck: Neck supple. No JVD present. No tracheal deviation present. No thyromegaly present.    Cardiovascular: Normal rate, regular rhythm, normal heart sounds and intact distal pulses.   Pulmonary/Chest: Effort normal and breath sounds normal.  Abdominal: Soft. Bowel sounds are normal. She exhibits no distension and no mass. There is no tenderness. There is no rebound and no guarding.  Musculoskeletal:  Obvious increased lateral curvature of thoracic spine present. Tenderness of right cervical musculature along line of upper trapezius. Decreased neck lateral bending. Denies radiating sensation to her right upper extremity.  All other joints with normal range of motion and normal strength.   Lymphadenopathy:    She has no cervical adenopathy.  Neurological: She is alert and oriented to person, place, and time. She has normal reflexes. No cranial nerve deficit. She exhibits normal muscle tone. Coordination normal.  Skin: Skin is warm and dry.  Psychiatric: Mood, memory, affect and judgment normal.  Nursing note and vitals reviewed.       Assessment:          Plan:     During the course of the visit the patient was educated and counseled about appropriate screening and preventive services including:    Pneumococcal vaccine   Screening mammography  Diabetes screening  Nutrition counseling   Diet review for nutrition referral? Yes ____  Not Indicated __X__   Patient Instructions (the written plan) was given to the patient.  Medicare Attestation I have personally reviewed: The patient's medical and social history Their use of alcohol, tobacco or illicit drugs Their current medications and supplements The patient's functional ability including ADLs,fall risks, home safety risks, cognitive, and hearing and visual impairment Diet and physical activities Evidence for depression or mood disorders  The patient's weight, height, BMI, and visual acuity have been recorded in the chart.  I have made referrals, counseling, and provided education to the patient based on  review of the above and I have provided the patient with a written personalized care plan for  preventive services.     Jeanine Luz, FNP   06/24/2014

## 2014-06-24 NOTE — Assessment & Plan Note (Signed)
Symptoms and exam consistent with trapezius muscle spasm possibly related to her scoliosis. Continue OTC remedy of moist heat 2-3x per day for 20 minutes. Stretch neck muscles in all directions. Continue to take the Aleve as needed for pain. Follow up if symptoms worsen or fail to improve.

## 2014-06-24 NOTE — Assessment & Plan Note (Addendum)
1) Anticipatory Guidance: Discussed importance of wearing a seatbelt while driving and not texting while driving; changing batteries in smoke detector at least once annually; wearing suntan lotion when outside; eating a balanced and moderate diet; getting physical activity at least 30 minutes per day. Discussed decreasing risk for falls and clearing clutter on the floor, well lit rooms, and removal of loose floor rugs.   2) Immunizations / Screenings / Labs:  Prevnar given today. Pt refused zostavax at this time. All other immunizations are up to date./ Mammogram ordered. All other screenings are up to date. / Obtain BMET, CBC, Lipid profile and hepatic panel.   Follow up wellness exam in 1 year. Follow up chronic conditions per Dr. Felicity CoyerLeschber.

## 2014-06-29 ENCOUNTER — Other Ambulatory Visit: Payer: Self-pay

## 2014-06-29 MED ORDER — HYDROCHLOROTHIAZIDE 25 MG PO TABS: 25.0000 mg | ORAL_TABLET | Freq: Every day | ORAL | Status: DC

## 2014-07-08 ENCOUNTER — Other Ambulatory Visit: Payer: Self-pay | Admitting: Internal Medicine

## 2014-07-08 NOTE — Telephone Encounter (Signed)
   Prolonged corticosteroid use has significant adverse effects including immune suppression and loss of bone integrity. If symptoms are persisting; referral to pulmonary is indicated.

## 2014-07-14 ENCOUNTER — Ambulatory Visit: Payer: Medicare Other

## 2014-08-11 ENCOUNTER — Other Ambulatory Visit: Payer: Self-pay | Admitting: Internal Medicine

## 2014-08-25 ENCOUNTER — Ambulatory Visit
Admission: RE | Admit: 2014-08-25 | Discharge: 2014-08-25 | Disposition: A | Discharge status: Home / Self Care | Payer: 59 | Source: Ambulatory Visit | Attending: Family | Admitting: Family

## 2014-08-25 ENCOUNTER — Encounter: Payer: Self-pay | Admitting: Family

## 2014-08-25 DIAGNOSIS — Z1231 Encounter for screening mammogram for malignant neoplasm of breast: Secondary | ICD-10-CM

## 2014-09-07 ENCOUNTER — Other Ambulatory Visit: Payer: Self-pay | Admitting: Internal Medicine

## 2014-09-27 ENCOUNTER — Other Ambulatory Visit: Payer: Self-pay | Admitting: Internal Medicine

## 2014-11-07 ENCOUNTER — Other Ambulatory Visit: Payer: Self-pay | Admitting: Internal Medicine

## 2014-12-02 ENCOUNTER — Other Ambulatory Visit: Payer: Self-pay | Admitting: Internal Medicine

## 2014-12-23 ENCOUNTER — Other Ambulatory Visit: Payer: Self-pay | Admitting: Internal Medicine

## 2014-12-29 ENCOUNTER — Ambulatory Visit: Payer: 59 | Admitting: Internal Medicine

## 2014-12-29 ENCOUNTER — Ambulatory Visit: Payer: Medicare Other | Admitting: Internal Medicine

## 2015-01-05 ENCOUNTER — Ambulatory Visit: Payer: 59 | Admitting: Internal Medicine

## 2015-02-04 ENCOUNTER — Other Ambulatory Visit: Payer: Self-pay | Admitting: Internal Medicine

## 2015-02-24 ENCOUNTER — Other Ambulatory Visit: Payer: Self-pay | Admitting: Internal Medicine

## 2015-03-25 ENCOUNTER — Other Ambulatory Visit: Payer: Self-pay | Admitting: Internal Medicine

## 2015-04-20 ENCOUNTER — Encounter: Payer: Self-pay | Admitting: Family

## 2015-04-20 ENCOUNTER — Other Ambulatory Visit (INDEPENDENT_AMBULATORY_CARE_PROVIDER_SITE_OTHER): Payer: Medicare Other

## 2015-04-20 ENCOUNTER — Ambulatory Visit (INDEPENDENT_AMBULATORY_CARE_PROVIDER_SITE_OTHER): Payer: Medicare Other | Admitting: Family

## 2015-04-20 VITALS — BP 118/80 | HR 89 | Temp 98.3°F | Resp 18 | Ht 60.0 in | Wt 191.0 lb

## 2015-04-20 DIAGNOSIS — E785 Hyperlipidemia, unspecified: Secondary | ICD-10-CM

## 2015-04-20 DIAGNOSIS — Z23 Encounter for immunization: Secondary | ICD-10-CM | POA: Diagnosis not present

## 2015-04-20 DIAGNOSIS — Z0000 Encounter for general adult medical examination without abnormal findings: Secondary | ICD-10-CM | POA: Diagnosis not present

## 2015-04-20 DIAGNOSIS — I10 Essential (primary) hypertension: Secondary | ICD-10-CM | POA: Diagnosis not present

## 2015-04-20 DIAGNOSIS — F329 Major depressive disorder, single episode, unspecified: Secondary | ICD-10-CM

## 2015-04-20 DIAGNOSIS — M542 Cervicalgia: Secondary | ICD-10-CM

## 2015-04-20 DIAGNOSIS — F32A Depression, unspecified: Secondary | ICD-10-CM

## 2015-04-20 LAB — CBC
HEMATOCRIT: 44.5 % (ref 36.0–46.0)
HEMOGLOBIN: 14.8 g/dL (ref 12.0–15.0)
MCHC: 33.3 g/dL (ref 30.0–36.0)
MCV: 91.8 fl (ref 78.0–100.0)
PLATELETS: 341 10*3/uL (ref 150.0–400.0)
RBC: 4.84 Mil/uL (ref 3.87–5.11)
RDW: 13.5 % (ref 11.5–15.5)
WBC: 4.4 10*3/uL (ref 4.0–10.5)

## 2015-04-20 LAB — LIPID PANEL
CHOLESTEROL: 147 mg/dL (ref 0–200)
HDL: 38 mg/dL — AB (ref >39.00)
LDL Cholesterol: 79 mg/dL (ref 0–99)
NonHDL: 108.88
TRIGLYCERIDES: 148 mg/dL (ref 0.0–149.0)
Total CHOL/HDL Ratio: 4
VLDL: 29.6 mg/dL (ref 0.0–40.0)

## 2015-04-20 LAB — COMPREHENSIVE METABOLIC PANEL
ALK PHOS: 92 U/L (ref 39–117)
ALT: 12 U/L (ref 0–35)
AST: 24 U/L (ref 0–37)
Albumin: 4.3 g/dL (ref 3.5–5.2)
BILIRUBIN TOTAL: 0.9 mg/dL (ref 0.2–1.2)
BUN: 15 mg/dL (ref 6–23)
CALCIUM: 9.6 mg/dL (ref 8.4–10.5)
CO2: 32 mEq/L (ref 19–32)
Chloride: 95 mEq/L — ABNORMAL LOW (ref 96–112)
Creatinine, Ser: 0.63 mg/dL (ref 0.40–1.20)
GFR: 99.35 mL/min (ref >60.00)
Glucose, Bld: 103 mg/dL — ABNORMAL HIGH (ref 70–99)
POTASSIUM: 3.8 meq/L (ref 3.5–5.1)
Sodium: 135 mEq/L (ref 135–145)
TOTAL PROTEIN: 7.3 g/dL (ref 6.0–8.3)

## 2015-04-20 LAB — TSH: TSH: 3.28 u[IU]/mL (ref 0.35–4.50)

## 2015-04-20 NOTE — Assessment & Plan Note (Signed)
Continues to experience neck pain most likely related to tight musculature secondary to her scoliosis and PPS. Recommend conservative treatment with OTC medications, heat and stretching. Follow up if symptoms worsen or fail to improve.

## 2015-04-20 NOTE — Assessment & Plan Note (Signed)
Reviewed and updated patient's medical, surgical, family and social history. Medications and allergies were also reviewed. Basic screenings for depression, activities of daily living, hearing, cognition and safety were performed. Provider list was updated and health plan was provided to the patient.  

## 2015-04-20 NOTE — Assessment & Plan Note (Signed)
1) Anticipatory Guidance: Discussed importance of wearing a seatbelt while driving and not texting while driving; changing batteries in smoke detector at least once annually; wearing suntan lotion when outside; eating a balanced and moderate diet; getting physical activity at least 30 minutes per day.  2) Immunizations / Screenings / Labs:  High dose influenza given. Zostavax declined. All other immunizations are up to date per recommendations. Due for a dental and vision screening. Obtain CBC, BMET, Lipid profile and TSH.   Overall well exam with several risk factors for cardiovascular disease including hyperlipidemia and hypertension. Both chronic conditions are currently well controlled with medications and she denies adverse side effects. Continue to work on physical activity as tolerated and able. Discussed importance of emphasizing nutrient dense foods and decreasing saturated fats while limiting red meat. Continue current healthy lifestyle behaviors. Follow up prevention exam in 1 year. Follow up office visit pending lab work.

## 2015-04-20 NOTE — Progress Notes (Signed)
Pre visit review using our clinic review tool, if applicable. No additional management support is needed unless otherwise documented below in the visit note. 

## 2015-04-20 NOTE — Progress Notes (Signed)
Subjective:    Patient ID: Wendy Yates, female    DOB: 01-Jun-1946, 69 y.o.   MRN: [id number]  Chief Complaint  Patient presents with  . CPE    Handicap form, fasting, lays on right side at night, pain shoots from neck to shoulder to arm, feels like its muscular    HPI:  Wendy Yates is a 69 y.o. female who presents today for an annual wellness visit.   1) Health Maintenance -   Diet - Averages about 3 meals consisting of dairy, vegetables, fruits, beef, pork and chicken; 2 cups of caffeine per day  Exercise - Silver sneakers 4x per week   2) Preventative Exams / Immunizations:  Dental -- Due for exam  Vision -- Due for exam    Health Maintenance  Topic Date Due  . Hepatitis C Screening  05-21-1946  . INFLUENZA VACCINE  03/22/2015  . TETANUS/TDAP  07/26/2015  . MAMMOGRAM  08/25/2016  . COLONOSCOPY  04/01/2022  . DEXA SCAN  Completed  . ZOSTAVAX  Addressed  . PNA vac Low Risk Adult  Completed     Immunization History  Administered Date(s) Administered  . Influenza Split 05/28/2012  . Influenza, High Dose Seasonal PF 06/03/2013, 04/20/2015  . Influenza,inj,Quad PF,36+ Mos 06/24/2014  . Pneumococcal Conjugate-13 06/24/2014  . Pneumococcal Polysaccharide-23 08/22/2003, 11/28/2011  . Td 08/21/2004     RISK FACTORS  Tobacco History  Smoking status  . Former Smoker -- 0.50 packs/day for 10 years  . Types: Cigarettes  . Quit date: 08/21/2009  Smokeless tobacco  . Never Used     Cardiac risk factors: advanced age (older than 55 for men, 21 for women), dyslipidemia, hypertension and obesity (BMI >= 30 kg/m2).  Depression Screen Depression screen [facility] 2/9 04/20/2015 06/03/2013  Decreased Interest 1 0  Down, Depressed, Hopeless 1 1  PHQ - 2 Score 2 1  Altered sleeping 0 -  Tired, decreased energy 0 -  Change in appetite 3 -  Feeling bad or failure about yourself  3 -  Trouble concentrating 0 -  Moving slowly or fidgety/restless 0 -  Suicidal  thoughts 0 -  PHQ-9 Score 8 -  Difficult doing work/chores Not difficult at all -     Activities of Daily Living In your present state of health, do you have any difficulty performing the following activities?:  Driving? No Managing money?  No Feeding yourself? No Getting from bed to chair? No Climbing a flight of stairs? No Preparing food and eating?: No Bathing or showering? No Getting dressed: No Getting to the toilet? No Using the toilet: No Moving around from place to place: No In the past year have you fallen or had a near fall?:No   Home Safety Has smoke detector and wears seat belts. No firearms. No excess sun exposure. Are there smokers in your home (other than you)?  No Do you feel safe at home?  Yes   Hearing Difficulties: No Do you often ask people to speak up or repeat themselves? No Do you experience ringing or noises in your ears? No  Do you have difficulty understanding soft or whispered voices? No    Cognitive Testing  Alert? Yes   Normal Appearance? Yes  Oriented to person? Yes  Place? Yes   Time? Yes  Recall of three objects?  Yes  Can perform simple calculations? Yes  Displays appropriate judgment? Yes  Can read the correct time from a watch face? Yes  Do you  feel that you have a problem with memory? No  Do you often misplace items? No   Advanced Directives have been discussed with the patient? Yes  Current Physicians/Providers and Suppliers  1. Rene Paci, MD - PCP / Internal Medicine 2. Sharolyn Douglas, MD - Orthopedics  Indicate any recent Medical Services you may have received from other than [facility] providers in the past year (date may be approximate).  All answers were reviewed with the patient and necessary referrals were made:  Jeanine Luz, FNP   04/20/2015   3.) Neck, shoulder and arm pain - Associated symptom of pain located in her left neck, shoulder, and arm described as shooting and aggravated when she lays on it has been going on  for a couple of months. Modifying factors include Aleve which does help with her pain. Course has slightly improved with time and severity is enough to restrict her activities and exercise. Reports today that it does feel improved. Notes that it started when she was assisting her father to a pillow.   Allergies  Allergen Reactions  . Morphine And Related Other (See Comments)    "crazy"  . Sulfa Antibiotics Swelling and Other (See Comments)    "swollen joints and extreme pain"     Outpatient Prescriptions Prior to Visit  Medication Sig Dispense Refill  . albuterol (PROVENTIL) (2.5 MG/3ML) 0.083% nebulizer solution Take 3 mLs (2.5 mg total) by nebulization 4 (four) times daily as needed. 75 mL 0  . atorvastatin (LIPITOR) 10 MG tablet Take 1 tablet (10 mg total) by mouth daily. 90 tablet 0  . busPIRone (BUSPAR) 15 MG tablet TAKE 1 TABLET BY MOUTH TWICE DAILY 180 tablet 3  . Calcium Carbonate-Vitamin D (CALTRATE 600+D) 600-400 MG-UNIT per tablet Take 1 tablet by mouth 2 (two) times daily.    . Cyanocobalamin (B-12) 2500 MCG TABS Take by mouth daily.    . fexofenadine (ALLEGRA) 180 MG tablet Take 1 tablet (180 mg total) by mouth daily.    . hydrochlorothiazide (HYDRODIURIL) 25 MG tablet TAKE 1 TABLET BY MOUTH EVERY DAY. 90 tablet 1  . losartan (COZAAR) 100 MG tablet Take 1 tablet (100 mg total) by mouth daily. 90 tablet 0  . montelukast (SINGULAIR) 10 MG tablet Take 1 tablet (10 mg total) by mouth at bedtime. 90 tablet 0  . Multiple Vitamin (DAILY MULTIVITAMIN PO) Take by mouth daily.    . naproxen sodium (ALEVE) 220 MG tablet Take 220 mg by mouth as needed.    Marland Kitchen omeprazole (PRILOSEC) 20 MG capsule Take 1 capsule (20 mg total) by mouth daily. 90 capsule 3  . PROAIR HFA 108 (90 BASE) MCG/ACT inhaler INHALE 2 PUFFS INTO THE LUNGS EVERY 6 HOURS AS NEEDED FOR WHEEZING 8.5 g 0  . venlafaxine XR (EFFEXOR-XR) 37.5 MG 24 hr capsule Take 2 capsules (75 mg total) by mouth daily with breakfast. 180 capsule  0  . venlafaxine XR (EFFEXOR XR) 37.5 MG 24 hr capsule Take 1 capsule (37.5 mg total) by mouth daily. 30 capsule 1  . Fluticasone-Salmeterol (ADVAIR DISKUS) 250-50 MCG/DOSE AEPB Inhale 1 puff into the lungs 2 (two) times daily. 1 each 3   No facility-administered medications prior to visit.     Past Medical History  Diagnosis Date  . Asthma, extrinsic   . Hypertension   . Osteopenia     DEXA 02/2012: -2.0; 12/19/07: -2.1; 08/17/04: -1.6  . Hyperlipidemia   . Post-polio syndrome     polio age 54,  RLE most  affected  . Depression   . Anxiety   . Urine incontinence   . Allergic rhinitis, cause unspecified   . Scoliosis     1979 harrington rod surgery     Past Surgical History  Procedure Laterality Date  . Multiple surgeries to allow her to walk post polio    . Back surgery  1979    harrington rod inserted  . Left hammertoe repair    . Tubal ligation    . Cholecystectomy  1969  . Tonsillectomy  1952  . Breast biopsy       Family History  Problem Relation Age of Onset  . Alcohol abuse Mother   . Arthritis Mother   . Arthritis Father   . Colon cancer Neg Hx      Social History   Social History  . Marital Status: Single    Spouse Name: N/A  . Number of Children: 2  . Years of Education: 16   Occupational History  . Not on file.   Social History Main Topics  . Smoking status: Former Smoker -- 0.50 packs/day for 10 years    Types: Cigarettes    Quit date: 08/21/2009  . Smokeless tobacco: Never Used  . Alcohol Use: Yes     Comment: maybe once a month per pt.  . Drug Use: No  . Sexual Activity: Not on file   Other Topics Concern  . Not on file   Social History Narrative   Single, lives alone - but parents and sister near by in town   Has electric WC within home, walks with cane   Denies abuse and feels safe at home.     Review of Systems  Constitutional: Denies fever, chills, fatigue, or significant weight gain/loss. HENT: Head: Denies headache or  neck pain Ears: Denies changes in hearing, ringing in ears, earache, drainage Nose: Denies discharge, stuffiness, itching, nosebleed, sinus pain Throat: Denies sore throat, hoarseness, dry mouth, sores, thrush Eyes: Denies loss/changes in vision, pain, redness, blurry/double vision, flashing lights Cardiovascular: Denies chest pain/discomfort, tightness, palpitations, shortness of breath with activity, difficulty lying down, swelling, sudden awakening with shortness of breath Respiratory: Denies shortness of breath, cough, sputum production, wheezing Gastrointestinal: Denies dysphasia, heartburn, change in appetite, nausea, change in bowel habits, rectal bleeding, constipation, diarrhea, yellow skin or eyes Genitourinary: Denies frequency, urgency, burning/pain, blood in urine, incontinence, change in urinary strength. Musculoskeletal: Denies muscle/joint pain, stiffness, back pain, redness or swelling of joints, trauma Skin: Denies rashes, lumps, itching, dryness, color changes, or hair/nail changes Neurological: Denies dizziness, fainting, seizures, weakness, numbness, tingling, tremor Psychiatric - Denies nervousness, stress, depression or memory loss Endocrine: Denies heat or cold intolerance, sweating, frequent urination, excessive thirst, changes in appetite Hematologic: Denies ease of bruising or bleeding    Objective:    BP 118/80 mmHg  Pulse 89  Temp(Src) 98.3 F (36.8 C) (Oral)  Resp 18  Ht 5' (1.524 m)  Wt 191 lb (86.637 kg)  BMI 37.30 kg/m2  SpO2 92% Nursing note and vital signs reviewed.  Physical Exam  Constitutional: She is oriented to person, place, and time. She appears well-developed and well-nourished.  HENT:  Head: Normocephalic.  Right Ear: Hearing, tympanic membrane, external ear and ear canal normal.  Left Ear: Hearing, tympanic membrane, external ear and ear canal normal.  Nose: Nose normal.  Mouth/Throat: Uvula is midline, oropharynx is clear and moist and  mucous membranes are normal.  Eyes: Conjunctivae and EOM are normal. Pupils are equal, round, and  reactive to light.  Neck: Neck supple. No JVD present. No tracheal deviation present. No thyromegaly present.  No obvious deformity, discoloration or edema. Scoliosis noted. Tenderness noted over left cervical musculature. Range of motion is decreased in lateral bending in both directions. Distal pulses and sensations are intact and appropriate.   Cardiovascular: Normal rate, regular rhythm, normal heart sounds and intact distal pulses.   Pulmonary/Chest: Effort normal and breath sounds normal.  Abdominal: Soft. Bowel sounds are normal. She exhibits no distension and no mass. There is no tenderness. There is no rebound and no guarding.  Musculoskeletal: Normal range of motion. She exhibits no edema or tenderness.  Lymphadenopathy:    She has no cervical adenopathy.  Neurological: She is alert and oriented to person, place, and time. She has normal reflexes. No cranial nerve deficit. She exhibits normal muscle tone. Coordination normal.  Skin: Skin is warm and dry.  Psychiatric: She has a normal mood and affect. Her behavior is normal. Judgment and thought content normal.       Assessment & Plan:   During the course of the visit the patient was educated and counseled about appropriate screening and preventive services including:    Pneumococcal vaccine   Colorectal cancer screening  Diabetes screening  Glaucoma screening  Nutrition counseling   Diet review for nutrition referral? Yes ____  Not Indicated _X___   Patient Instructions (the written plan) was given to the patient.  Medicare Attestation I have personally reviewed: The patient's medical and social history Their use of alcohol, tobacco or illicit drugs Their current medications and supplements The patient's functional ability including ADLs,fall risks, home safety risks, cognitive, and hearing and visual impairment Diet  and physical activities Evidence for depression or mood disorders  The patient's weight, height, BMI,  have been recorded in the chart.  I have made referrals, counseling, and provided education to the patient based on review of the above and I have provided the patient with a written personalized care plan for preventive services.     Jeanine Luz, FNP   04/20/2015

## 2015-04-20 NOTE — Patient Instructions (Addendum)
Thank you for choosing [facility].  Summary/Instructions:   Please stop by the lab on the basement level of the building for your blood work. Your results will be released to [facility] (or called to you) after review, usually within 72 hours after test completion. If any changes need to be made, you will be notified at that same time.  If your symptoms worsen or fail to improve, please contact our office for further instruction, or in case of emergency go directly to the emergency room at the closest medical facility.   Health Maintenance  Topic Date Due  . Hepatitis C Screening  [birth year]  . INFLUENZA VACCINE  03/22/2015  . TETANUS/TDAP  07/26/2015  . MAMMOGRAM  08/25/2016  . COLONOSCOPY  04/01/2022  . DEXA SCAN  Completed  . ZOSTAVAX  Addressed  . PNA vac Low Risk Adult  Completed   Health Maintenance Adopting a healthy lifestyle and getting preventive care can go a long way to promote health and wellness. Talk with your health care provider about what schedule of regular examinations is right for you. This is a good chance for you to check in with your provider about disease prevention and staying healthy. In between checkups, there are plenty of things you can do on your own. Experts have done a lot of research about which lifestyle changes and preventive measures are most likely to keep you healthy. Ask your health care provider for more information. WEIGHT AND DIET  Eat a healthy diet  Be sure to include plenty of vegetables, fruits, low-fat dairy products, and lean protein.  Do not eat a lot of foods high in solid fats, added sugars, or salt.  Get regular exercise. This is one of the most important things you can do for your health.  Most adults should exercise for at least 150 minutes each week. The exercise should increase your heart rate and make you sweat (moderate-intensity exercise).  Most adults should also do strengthening exercises at least twice a week. This  is in addition to the moderate-intensity exercise.  Maintain a healthy weight  Body mass index (BMI) is a measurement that can be used to identify possible weight problems. It estimates body fat based on height and weight. Your health care provider can help determine your BMI and help you achieve or maintain a healthy weight.  For females 5 years of age and older:   A BMI below 18.5 is considered underweight.  A BMI of 18.5 to 24.9 is normal.  A BMI of 25 to 29.9 is considered overweight.  A BMI of 30 and above is considered obese.  Watch levels of cholesterol and blood lipids  You should start having your blood tested for lipids and cholesterol at 70 years of age, then have this test every 5 years.  You may need to have your cholesterol levels checked more often if:  Your lipid or cholesterol levels are high.  You are older than 70 years of age.  You are at high risk for heart disease.  CANCER SCREENING   Lung Cancer  Lung cancer screening is recommended for adults 65-50 years old who are at high risk for lung cancer because of a history of smoking.  A yearly low-dose CT scan of the lungs is recommended for people who:  Currently smoke.  Have quit within the past 15 years.  Have at least a 30-pack-year history of smoking. A pack year is smoking an average of one pack of cigarettes a day  for 1 year.  Yearly screening should continue until it has been 15 years since you quit.  Yearly screening should stop if you develop a health problem that would prevent you from having lung cancer treatment.  Breast Cancer  Practice breast self-awareness. This means understanding how your breasts normally appear and feel.  It also means doing regular breast self-exams. Let your health care provider know about any changes, no matter how small.  If you are in your 20s or 30s, you should have a clinical breast exam (CBE) by a health care provider every 1-3 years as part of a  regular health exam.  If you are 62 or older, have a CBE every year. Also consider having a breast X-ray (mammogram) every year.  If you have a family history of breast cancer, talk to your health care provider about genetic screening.  If you are at high risk for breast cancer, talk to your health care provider about having an MRI and a mammogram every year.  Breast cancer gene (BRCA) assessment is recommended for women who have family members with BRCA-related cancers. BRCA-related cancers include:  Breast.  Ovarian.  Tubal.  Peritoneal cancers.  Results of the assessment will determine the need for genetic counseling and BRCA1 and BRCA2 testing. Cervical Cancer Routine pelvic examinations to screen for cervical cancer are no longer recommended for nonpregnant women who are considered low risk for cancer of the pelvic organs (ovaries, uterus, and vagina) and who do not have symptoms. A pelvic examination may be necessary if you have symptoms including those associated with pelvic infections. Ask your health care provider if a screening pelvic exam is right for you.   The Pap test is the screening test for cervical cancer for women who are considered at risk.  If you had a hysterectomy for a problem that was not cancer or a condition that could lead to cancer, then you no longer need Pap tests.  If you are older than 65 years, and you have had normal Pap tests for the past 10 years, you no longer need to have Pap tests.  If you have had past treatment for cervical cancer or a condition that could lead to cancer, you need Pap tests and screening for cancer for at least 20 years after your treatment.  If you no longer get a Pap test, assess your risk factors if they change (such as having a new sexual partner). This can affect whether you should start being screened again.  Some women have medical problems that increase their chance of getting cervical cancer. If this is the case for  you, your health care provider may recommend more frequent screening and Pap tests.  The human papillomavirus (HPV) test is another test that may be used for cervical cancer screening. The HPV test looks for the virus that can cause cell changes in the cervix. The cells collected during the Pap test can be tested for HPV.  The HPV test can be used to screen women 60 years of age and older. Getting tested for HPV can extend the interval between normal Pap tests from three to five years.  An HPV test also should be used to screen women of any age who have unclear Pap test results.  After 70 years of age, women should have HPV testing as often as Pap tests.  Colorectal Cancer  This type of cancer can be detected and often prevented.  Routine colorectal cancer screening usually begins at 70 years  of age and continues through 70 years of age.  Your health care provider may recommend screening at an earlier age if you have risk factors for colon cancer.  Your health care provider may also recommend using home test kits to check for hidden blood in the stool.  A small camera at the end of a tube can be used to examine your colon directly (sigmoidoscopy or colonoscopy). This is done to check for the earliest forms of colorectal cancer.  Routine screening usually begins at age 64.  Direct examination of the colon should be repeated every 5-10 years through 70 years of age. However, you may need to be screened more often if early forms of precancerous polyps or small growths are found. Skin Cancer  Check your skin from head to toe regularly.  Tell your health care provider about any new moles or changes in moles, especially if there is a change in a mole's shape or color.  Also tell your health care provider if you have a mole that is larger than the size of a pencil eraser.  Always use sunscreen. Apply sunscreen liberally and repeatedly throughout the day.  Protect yourself by wearing long  sleeves, pants, a wide-brimmed hat, and sunglasses whenever you are outside. HEART DISEASE, DIABETES, AND HIGH BLOOD PRESSURE   Have your blood pressure checked at least every 1-2 years. High blood pressure causes heart disease and increases the risk of stroke.  If you are between 69 years and 72 years old, ask your health care provider if you should take aspirin to prevent strokes.  Have regular diabetes screenings. This involves taking a blood sample to check your fasting blood sugar level.  If you are at a normal weight and have a low risk for diabetes, have this test once every three years after 70 years of age.  If you are overweight and have a high risk for diabetes, consider being tested at a younger age or more often. PREVENTING INFECTION  Hepatitis B  If you have a higher risk for hepatitis B, you should be screened for this virus. You are considered at high risk for hepatitis B if:  You were born in a country where hepatitis B is common. Ask your health care provider which countries are considered high risk.  Your parents were born in a high-risk country, and you have not been immunized against hepatitis B (hepatitis B vaccine).  You have HIV or AIDS.  You use needles to inject street drugs.  You live with someone who has hepatitis B.  You have had sex with someone who has hepatitis B.  You get hemodialysis treatment.  You take certain medicines for conditions, including cancer, organ transplantation, and autoimmune conditions. Hepatitis C  Blood testing is recommended for:  Everyone born from [birth year] through 1966.  Anyone with known risk factors for hepatitis C. Sexually transmitted infections (STIs)  You should be screened for sexually transmitted infections (STIs) including gonorrhea and chlamydia if:  You are sexually active and are younger than 70 years of age.  You are older than 70 years of age and your health care provider tells you that you are at risk for  this type of infection.  Your sexual activity has changed since you were last screened and you are at an increased risk for chlamydia or gonorrhea. Ask your health care provider if you are at risk.  If you do not have HIV, but are at risk, it may be recommended that you take  a prescription medicine daily to prevent HIV infection. This is called pre-exposure prophylaxis (PrEP). You are considered at risk if:  You are sexually active and do not regularly use condoms or know the HIV status of your partner(s).  You take drugs by injection.  You are sexually active with a partner who has HIV. Talk with your health care provider about whether you are at high risk of being infected with HIV. If you choose to begin PrEP, you should first be tested for HIV. You should then be tested every 3 months for as long as you are taking PrEP.  PREGNANCY   If you are premenopausal and you may become pregnant, ask your health care provider about preconception counseling.  If you may become pregnant, take 400 to 800 micrograms (mcg) of folic acid every day.  If you want to prevent pregnancy, talk to your health care provider about birth control (contraception). OSTEOPOROSIS AND MENOPAUSE   Osteoporosis is a disease in which the bones lose minerals and strength with aging. This can result in serious bone fractures. Your risk for osteoporosis can be identified using a bone density scan.  If you are 50 years of age or older, or if you are at risk for osteoporosis and fractures, ask your health care provider if you should be screened.  Ask your health care provider whether you should take a calcium or vitamin D supplement to lower your risk for osteoporosis.  Menopause may have certain physical symptoms and risks.  Hormone replacement therapy may reduce some of these symptoms and risks. Talk to your health care provider about whether hormone replacement therapy is right for you.  HOME CARE INSTRUCTIONS    Schedule regular health, dental, and eye exams.  Stay current with your immunizations.   Do not use any tobacco products including cigarettes, chewing tobacco, or electronic cigarettes.  If you are pregnant, do not drink alcohol.  If you are breastfeeding, limit how much and how often you drink alcohol.  Limit alcohol intake to no more than 1 drink per day for nonpregnant women. One drink equals 12 ounces of beer, 5 ounces of wine, or 1 ounces of hard liquor.  Do not use street drugs.  Do not share needles.  Ask your health care provider for help if you need support or information about quitting drugs.  Tell your health care provider if you often feel depressed.  Tell your health care provider if you have ever been abused or do not feel safe at home. Document Released: 02/20/2011 Document Revised: 12/22/2013 Document Reviewed: 07/09/2013 [facility] Patient Information 2015 [place], [place]. This information is not intended to replace advice given to you by your health care provider. Make sure you discuss any questions you have with your health care provider.  Herpes Zoster Virus Vaccine What is this medicine? HERPES ZOSTER VIRUS VACCINE (HUR peez ZOS ter vahy ruhs vak SEEN) is a vaccine. It is used to prevent shingles in adults 70 years old and over. This vaccine is not used to treat shingles or nerve pain from shingles. This medicine may be used for other purposes; ask your health care provider or pharmacist if you have questions. COMMON BRAND NAME(S): Varivax, Zostavax What should I tell my health care provider before I take this medicine? They need to know if you have any of these conditions: -cancer like leukemia or lymphoma -immune system problems or therapy -infection with fever -tuberculosis -an unusual or allergic reaction to vaccines, neomycin, gelatin, other medicines, foods,  dyes, or preservatives -pregnant or trying to get pregnant -breast-feeding How should I use  this medicine? This vaccine is for injection under the skin. It is given by a health care professional. Talk to your pediatrician regarding the use of this medicine in children. This medicine is not approved for use in children. Overdosage: If you think you have taken too much of this medicine contact a poison control center or emergency room at once. NOTE: This medicine is only for you. Do not share this medicine with others. What if I miss a dose? This does not apply. What may interact with this medicine? Do not take this medicine with any of the following medications: -adalimumab -anakinra -etanercept -infliximab -medicines to treat cancer -medicines that suppress your immune system This medicine may also interact with the following medications: -immunoglobulins -steroid medicines like prednisone or cortisone This list may not describe all possible interactions. Give your health care provider a list of all the medicines, herbs, non-prescription drugs, or dietary supplements you use. Also tell them if you smoke, drink alcohol, or use illegal drugs. Some items may interact with your medicine. What should I watch for while using this medicine? Visit your doctor for regular check ups. This vaccine, like all vaccines, may not fully protect everyone. After receiving this vaccine it may be possible to pass chickenpox infection to others. Avoid people with immune system problems, pregnant women who have not had chickenpox, and newborns of women who have not had chickenpox. Talk to your doctor for more information. What side effects may I notice from receiving this medicine? Side effects that you should report to your doctor or health care professional as soon as possible: -allergic reactions like skin rash, itching or hives, swelling of the face, lips, or tongue -breathing problems -feeling faint or lightheaded, falls -fever, flu-like symptoms -pain, tingling, numbness in the hands or  feet -swelling of the ankles, feet, hands -unusually weak or tired Side effects that usually do not require medical attention (report to your doctor or health care professional if they continue or are bothersome): -aches or pains -chickenpox-like rash -diarrhea -headache -loss of appetite -nausea, vomiting -redness, pain, swelling at site where injected -runny nose This list may not describe all possible side effects. Call your doctor for medical advice about side effects. You may report side effects to FDA at 1-800-FDA-1088. Where should I keep my medicine? This drug is given in a hospital or clinic and will not be stored at home. NOTE: This sheet is a summary. It may not cover all possible information. If you have questions about this medicine, talk to your doctor, pharmacist, or health care provider.  2015, Elsevier/Gold Standard. (2010-01-24 17:43:50)

## 2015-04-20 NOTE — Assessment & Plan Note (Signed)
PHQ 9 score of 8 indicating mild depression which is currently controlled with 75 mg of Effexor daily. Continue current dosage of Effexor. Denies suicidal ideations.

## 2015-04-21 ENCOUNTER — Encounter: Payer: Self-pay | Admitting: Family

## 2015-04-21 LAB — HEPATITIS C ANTIBODY: HCV Ab: NEGATIVE

## 2015-04-23 ENCOUNTER — Other Ambulatory Visit: Payer: Self-pay | Admitting: Family

## 2015-04-23 MED ORDER — FLUTICASONE-SALMETEROL 250-50 MCG/DOSE IN AEPB: 1.0000 | INHALATION_SPRAY | Freq: Two times a day (BID) | RESPIRATORY_TRACT | Status: DC

## 2015-04-30 ENCOUNTER — Other Ambulatory Visit: Payer: Self-pay | Admitting: Internal Medicine

## 2015-05-06 ENCOUNTER — Other Ambulatory Visit: Payer: Self-pay | Admitting: Internal Medicine

## 2015-05-07 ENCOUNTER — Other Ambulatory Visit: Payer: Self-pay

## 2015-05-07 MED ORDER — ALBUTEROL SULFATE HFA 108 (90 BASE) MCG/ACT IN AERS: 2.0000 | INHALATION_SPRAY | Freq: Four times a day (QID) | RESPIRATORY_TRACT | Status: DC | PRN

## 2015-05-07 NOTE — Addendum Note (Signed)
Addended by: Deatra James on: 05/07/2015 12:00 PM   Modules accepted: Orders

## 2015-05-25 DIAGNOSIS — H2513 Age-related nuclear cataract, bilateral: Secondary | ICD-10-CM | POA: Diagnosis not present

## 2015-06-13 ENCOUNTER — Encounter: Payer: Self-pay | Admitting: Family

## 2015-06-23 ENCOUNTER — Other Ambulatory Visit: Payer: Self-pay | Admitting: Internal Medicine

## 2015-06-25 ENCOUNTER — Telehealth: Payer: Self-pay | Admitting: Internal Medicine

## 2015-06-25 NOTE — Telephone Encounter (Signed)
Pt called asking about DMV paper work that was mail to [facility] two ago, never heard anything from our office. Pt also want to check on the medication for hydrochlorothiazide (HYDRODIURIL) 25 MG tablet that was send to Dr. Felicity CoyerLeschber, Tammy [facility]Greg can you check on that too, pt just had a physical with you.

## 2015-06-28 NOTE — Telephone Encounter (Signed)
Rx has already been sent. [facility] do you know anything about DMV paper work?

## 2015-06-28 NOTE — Telephone Encounter (Signed)
I have not received any DMV paperwork that I am aware of.

## 2015-06-29 NOTE — Telephone Encounter (Signed)
Please return patient phone call at [phone]

## 2015-06-29 NOTE — Telephone Encounter (Signed)
LVM for pt to call back and clarify.

## 2015-06-30 NOTE — Telephone Encounter (Signed)
Pt came in today and brought DMV paper work. The paper work was filled out, signed and given back to her. A copy was made just incase.

## 2015-07-26 DIAGNOSIS — J45901 Unspecified asthma with (acute) exacerbation: Secondary | ICD-10-CM | POA: Diagnosis not present

## 2015-08-17 ENCOUNTER — Encounter: Payer: Self-pay | Admitting: Internal Medicine

## 2015-08-17 ENCOUNTER — Ambulatory Visit (INDEPENDENT_AMBULATORY_CARE_PROVIDER_SITE_OTHER): Payer: Medicare Other | Admitting: Internal Medicine

## 2015-08-17 VITALS — BP 138/82 | HR 94 | Temp 97.6°F | Resp 16 | Ht 60.0 in | Wt 183.0 lb

## 2015-08-17 DIAGNOSIS — J4530 Mild persistent asthma, uncomplicated: Secondary | ICD-10-CM | POA: Diagnosis not present

## 2015-08-17 DIAGNOSIS — Z23 Encounter for immunization: Secondary | ICD-10-CM

## 2015-08-17 DIAGNOSIS — I10 Essential (primary) hypertension: Secondary | ICD-10-CM | POA: Diagnosis not present

## 2015-08-17 MED ORDER — ALBUTEROL SULFATE (2.5 MG/3ML) 0.083% IN NEBU: 2.5000 mg | INHALATION_SOLUTION | Freq: Four times a day (QID) | RESPIRATORY_TRACT | Status: DC | PRN

## 2015-08-17 MED ORDER — METHYLPREDNISOLONE ACETATE 40 MG/ML IJ SUSP: 40.0000 mg | Freq: Once | INTRAMUSCULAR | Status: DC

## 2015-08-17 MED ORDER — VENLAFAXINE HCL ER 37.5 MG PO CP24: 75.0000 mg | ORAL_CAPSULE | Freq: Every day | ORAL | Status: DC

## 2015-08-17 MED ORDER — ALBUTEROL SULFATE HFA 108 (90 BASE) MCG/ACT IN AERS: 2.0000 | INHALATION_SPRAY | Freq: Four times a day (QID) | RESPIRATORY_TRACT | Status: DC | PRN

## 2015-08-17 NOTE — Progress Notes (Signed)
   Subjective:    Patient ID: Wendy Yates, female    DOB: 1945-09-04, 69 y.o.   MRN: [id number]  HPI The patient is a 69 YO female coming in for follow up an urgent care visit for her asthma. She was having a flare and got steroid injection and multiple breathing treatments. She had low oxygen levels at that time. She is now feeling better and taking symbicort daily. She has symptoms generally in the fall and the spring with allergies. No symptoms or breathing problems in between. Taking allegra and zyrtec and singulair now. No cough or wheezing at this time. Uses albuterol before exercising (4 days a week with silver sneakers). 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   Review of Systems  Constitutional: Positive for fatigue. Negative for fever, chills, activity change, appetite change and unexpected weight change.  HENT: Negative for congestion, postnasal drip, rhinorrhea, sinus pressure and sore throat.   Eyes: Negative.   Respiratory: Negative for cough, chest tightness, shortness of breath and wheezing.   Cardiovascular: Negative for chest pain, palpitations and leg swelling.  Gastrointestinal: Negative for nausea, abdominal pain, diarrhea, constipation and abdominal distention.  Skin: Negative.   Neurological: Negative.       Objective:   Physical Exam  Constitutional: She is oriented to person, place, and time. She appears well-developed and well-nourished.  HENT:  Head: Normocephalic and atraumatic.  Nose: Nose normal.  Mouth/Throat: Oropharynx is clear and moist.  Eyes: EOM are normal.  Neck: Normal range of motion.  Cardiovascular: Normal rate and regular rhythm.   Pulmonary/Chest: Effort normal and breath sounds normal. No respiratory distress. She has no wheezes. She has no rales. She exhibits no tenderness.  Abdominal: Soft. She exhibits no distension. There is no tenderness. There is no rebound.  Musculoskeletal: She  exhibits no edema.  Neurological: She is alert and oriented to person, place, and time.  Skin: Skin is warm and dry.   Filed Vitals:   08/17/15 1013  BP: 138/82  Pulse: 94  Temp: 97.6 F (36.4 C)  TempSrc: Oral  Resp: 16  Height: 5' (1.524 m)  Weight: 183 lb (83.008 kg)  SpO2: 97%      Assessment & Plan:  Tdap given at visit.

## 2015-08-17 NOTE — Assessment & Plan Note (Signed)
Recent flare with good recovery. Talked to her about maintenance and prn usage. She will continue symbicort until January then use albuterol prn. She will use symbicort during her allergy season. Singulair all the time and add allegra and zyrtec during high allergy season.

## 2015-08-17 NOTE — Addendum Note (Signed)
Addended by: Conception ChancyOBERSON, Letia Guidry R on: 08/17/2015 01:41 PM   Modules accepted: Orders

## 2015-08-17 NOTE — Progress Notes (Signed)
Pre visit review using our clinic review tool, if applicable. No additional management support is needed unless otherwise documented below in the visit note. 

## 2015-08-17 NOTE — Patient Instructions (Signed)
You can start the symbicort the last week of September to help avoid an asthma flare in the fall. Do the same in the spring with starting Symbicort a couple weeks before you tend to get problems. Keep the albuterol with you in case you need it.   Come back in about 3-6 months and feel free to call us sooner if needed.   We have given you the tetanus today.

## 2015-08-17 NOTE — Assessment & Plan Note (Signed)
BP at goal on hctz and losartan. Last CMP reviewed and no indication for change.

## 2015-08-24 ENCOUNTER — Encounter: Payer: Self-pay | Admitting: Internal Medicine

## 2015-08-24 ENCOUNTER — Other Ambulatory Visit: Payer: Self-pay

## 2015-08-24 MED ORDER — BUSPIRONE HCL 15 MG PO TABS: 15.0000 mg | ORAL_TABLET | Freq: Two times a day (BID) | ORAL | Status: DC

## 2015-09-20 ENCOUNTER — Other Ambulatory Visit: Payer: Self-pay

## 2015-09-20 MED ORDER — HYDROCHLOROTHIAZIDE 25 MG PO TABS: 25.0000 mg | ORAL_TABLET | Freq: Every day | ORAL | Status: DC

## 2015-10-25 IMAGING — CR DG CHEST 2V
2 series · 2 of 2 positions shown · non-contrast
Comparison: April 12, 2008

CLINICAL DATA: Cough, shortness of breath for 2 weeks

EXAM:
CHEST  2 VIEW

[view not recorded (1 of 2)]
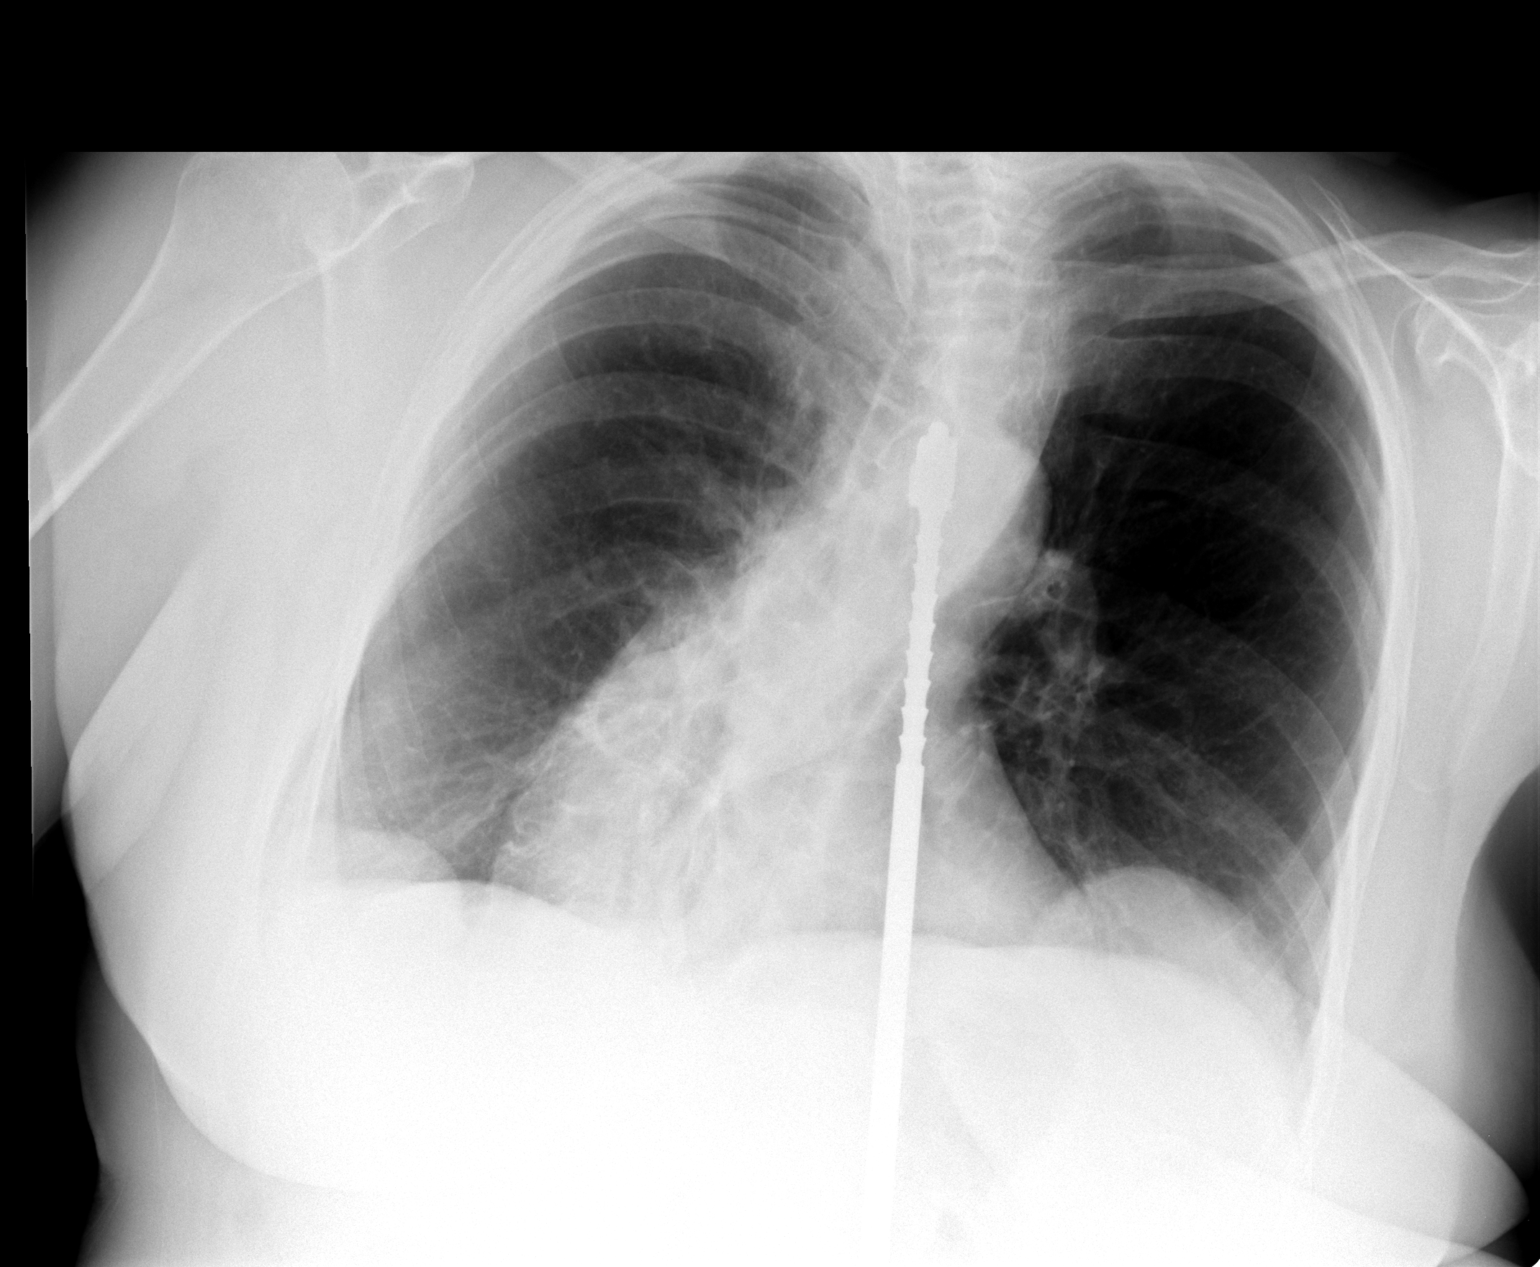

[view not recorded (2 of 2)]
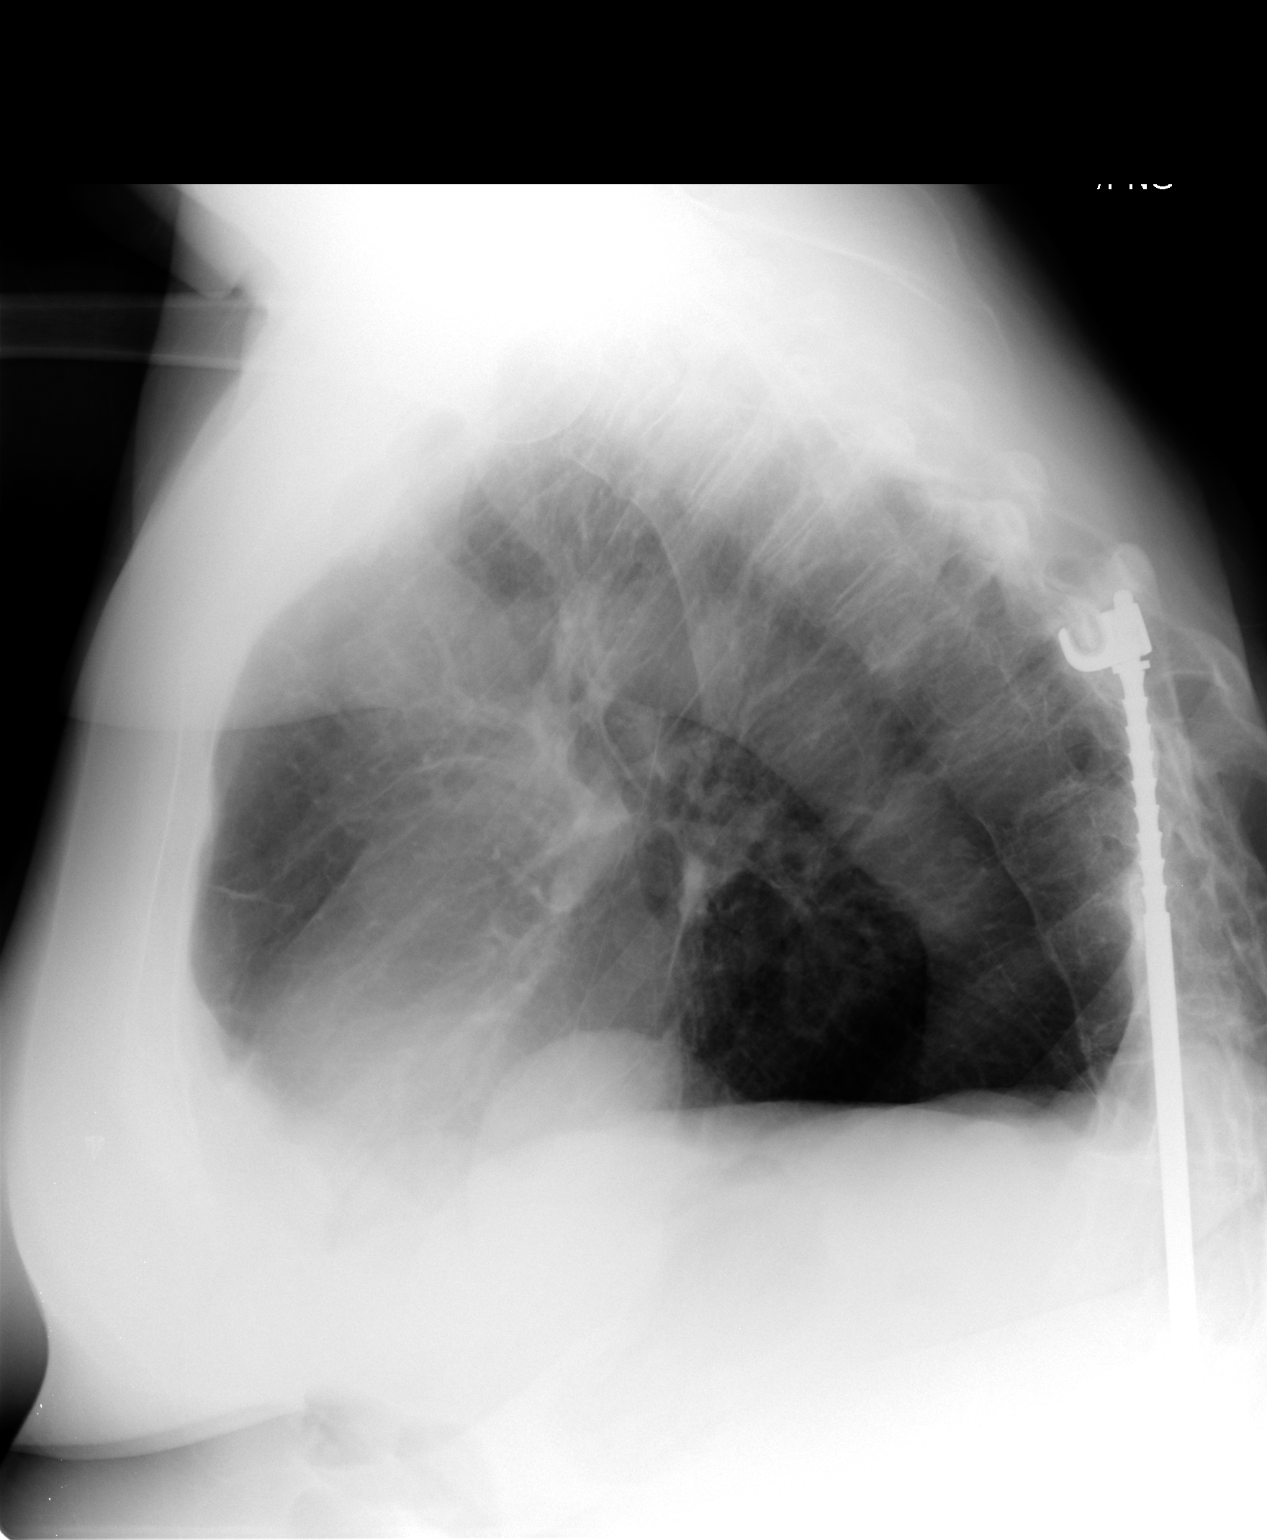

[2 of 2 positions shown; findings below may reference images not displayed]

FINDINGS: The heart size and mediastinal contours are stable. There is no
focal infiltrate, pulmonary edema, or pleural effusion. The
visualized skeletal structures are stable. Thoracolumbar scoliosis
with a Harrington rod is unchanged.
IMPRESSION: No active cardiopulmonary disease.

## 2015-11-02 ENCOUNTER — Other Ambulatory Visit: Payer: Self-pay

## 2015-11-02 MED ORDER — LOSARTAN POTASSIUM 100 MG PO TABS: 100.0000 mg | ORAL_TABLET | Freq: Every day | ORAL | Status: DC

## 2015-11-05 ENCOUNTER — Other Ambulatory Visit: Payer: Self-pay

## 2015-11-05 MED ORDER — ATORVASTATIN CALCIUM 10 MG PO TABS: 10.0000 mg | ORAL_TABLET | Freq: Every day | ORAL | Status: DC

## 2015-11-05 MED ORDER — MONTELUKAST SODIUM 10 MG PO TABS: 10.0000 mg | ORAL_TABLET | Freq: Every day | ORAL | Status: DC

## 2015-12-20 ENCOUNTER — Encounter: Payer: Self-pay | Admitting: Internal Medicine

## 2016-03-24 ENCOUNTER — Other Ambulatory Visit: Payer: Self-pay | Admitting: Internal Medicine

## 2016-05-15 ENCOUNTER — Encounter: Payer: Self-pay | Admitting: Internal Medicine

## 2016-05-16 MED ORDER — OMEPRAZOLE 20 MG PO CPDR: 20.0000 mg | DELAYED_RELEASE_CAPSULE | Freq: Every day | ORAL | 3 refills | Status: DC

## 2016-05-17 ENCOUNTER — Other Ambulatory Visit: Payer: Self-pay | Admitting: *Deleted

## 2016-05-19 ENCOUNTER — Ambulatory Visit (INDEPENDENT_AMBULATORY_CARE_PROVIDER_SITE_OTHER): Payer: Medicare Other | Admitting: Internal Medicine

## 2016-05-19 ENCOUNTER — Encounter: Payer: Self-pay | Admitting: Internal Medicine

## 2016-05-19 ENCOUNTER — Other Ambulatory Visit (INDEPENDENT_AMBULATORY_CARE_PROVIDER_SITE_OTHER): Payer: Medicare Other

## 2016-05-19 VITALS — BP 132/86 | HR 75 | Temp 98.5°F | Resp 12 | Ht 60.0 in | Wt 184.4 lb

## 2016-05-19 DIAGNOSIS — F32A Depression, unspecified: Secondary | ICD-10-CM

## 2016-05-19 DIAGNOSIS — E785 Hyperlipidemia, unspecified: Secondary | ICD-10-CM

## 2016-05-19 DIAGNOSIS — Z23 Encounter for immunization: Secondary | ICD-10-CM | POA: Diagnosis not present

## 2016-05-19 DIAGNOSIS — Z0000 Encounter for general adult medical examination without abnormal findings: Secondary | ICD-10-CM | POA: Diagnosis not present

## 2016-05-19 DIAGNOSIS — F329 Major depressive disorder, single episode, unspecified: Secondary | ICD-10-CM | POA: Diagnosis not present

## 2016-05-19 DIAGNOSIS — I10 Essential (primary) hypertension: Secondary | ICD-10-CM | POA: Diagnosis not present

## 2016-05-19 LAB — COMPREHENSIVE METABOLIC PANEL
ALBUMIN: 4.2 g/dL (ref 3.5–5.2)
ALT: 10 U/L (ref 0–35)
AST: 21 U/L (ref 0–37)
Alkaline Phosphatase: 98 U/L (ref 39–117)
BUN: 16 mg/dL (ref 6–23)
CHLORIDE: 99 meq/L (ref 96–112)
CO2: 33 mEq/L — ABNORMAL HIGH (ref 19–32)
Calcium: 9.4 mg/dL (ref 8.4–10.5)
Creatinine, Ser: 0.58 mg/dL (ref 0.40–1.20)
GFR: 108.96 mL/min (ref >60.00)
GLUCOSE: 90 mg/dL (ref 70–99)
POTASSIUM: 3.7 meq/L (ref 3.5–5.1)
SODIUM: 138 meq/L (ref 135–145)
Total Bilirubin: 0.9 mg/dL (ref 0.2–1.2)
Total Protein: 7.2 g/dL (ref 6.0–8.3)

## 2016-05-19 LAB — LIPID PANEL
CHOL/HDL RATIO: 4
Cholesterol: 160 mg/dL (ref 0–200)
HDL: 39.1 mg/dL (ref >39.00)
LDL Cholesterol: 90 mg/dL (ref 0–99)
NONHDL: 120.76
Triglycerides: 154 mg/dL — ABNORMAL HIGH (ref 0.0–149.0)
VLDL: 30.8 mg/dL (ref 0.0–40.0)

## 2016-05-19 LAB — CBC
HEMATOCRIT: 44.2 % (ref 36.0–46.0)
HEMOGLOBIN: 14.8 g/dL (ref 12.0–15.0)
MCHC: 33.5 g/dL (ref 30.0–36.0)
MCV: 88.9 fl (ref 78.0–100.0)
Platelets: 291 10*3/uL (ref 150.0–400.0)
RBC: 4.98 Mil/uL (ref 3.87–5.11)
RDW: 13.8 % (ref 11.5–15.5)
WBC: 3.5 10*3/uL — AB (ref 4.0–10.5)

## 2016-05-19 MED ORDER — SERTRALINE HCL 50 MG PO TABS: 50.0000 mg | ORAL_TABLET | Freq: Every day | ORAL | 3 refills | Status: DC

## 2016-05-19 NOTE — Assessment & Plan Note (Signed)
Checking lipid panel and adjust as needed. Taking lipitor daily.  

## 2016-05-19 NOTE — Assessment & Plan Note (Signed)
Colonoscopy and mammogram up to date. Given flu shot today. Shingles done, pneumonia series done. Tdap up to date. Counseled on sun safety and mole surveillance. Counseled on dangers of distracted driving.

## 2016-05-19 NOTE — Assessment & Plan Note (Signed)
She wants to switch, stop effexor and start 25 mg zoloft for 1 week then up to 50 mg zoloft.

## 2016-05-19 NOTE — Patient Instructions (Signed)
We will have you stop taking the effexor and then next day it is okay to start taking zoloft 25 mg.   We have given you 50 mg pills of zoloft so take 1/2 pill for the first week. After the first week increase to 1 pill daily.  We have given your flu shot and will check the labs today.   Health Maintenance, Female Adopting a healthy lifestyle and getting preventive care can go a long way to promote health and wellness. Talk with your health care provider about what schedule of regular examinations is right for you. This is a good chance for you to check in with your provider about disease prevention and staying healthy. In between checkups, there are plenty of things you can do on your own. Experts have done a lot of research about which lifestyle changes and preventive measures are most likely to keep you healthy. Ask your health care provider for more information. WEIGHT AND DIET  Eat a healthy diet  Be sure to include plenty of vegetables, fruits, low-fat dairy products, and lean protein.  Do not eat a lot of foods high in solid fats, added sugars, or salt.  Get regular exercise. This is one of the most important things you can do for your health.  Most adults should exercise for at least 150 minutes each week. The exercise should increase your heart rate and make you sweat (moderate-intensity exercise).  Most adults should also do strengthening exercises at least twice a week. This is in addition to the moderate-intensity exercise.  Maintain a healthy weight  Body mass index (BMI) is a measurement that can be used to identify possible weight problems. It estimates body fat based on height and weight. Your health care provider can help determine your BMI and help you achieve or maintain a healthy weight.  For females 40 years of age and older:   A BMI below 18.5 is considered underweight.  A BMI of 18.5 to 24.9 is normal.  A BMI of 25 to 29.9 is considered overweight.  A BMI of  30 and above is considered obese.  Watch levels of cholesterol and blood lipids  You should start having your blood tested for lipids and cholesterol at 71 years of age, then have this test every 5 years.  You may need to have your cholesterol levels checked more often if:  Your lipid or cholesterol levels are high.  You are older than 71 years of age.  You are at high risk for heart disease.  CANCER SCREENING   Lung Cancer  Lung cancer screening is recommended for adults 70-60 years old who are at high risk for lung cancer because of a history of smoking.  A yearly low-dose CT scan of the lungs is recommended for people who:  Currently smoke.  Have quit within the past 15 years.  Have at least a 30-pack-year history of smoking. A pack year is smoking an average of one pack of cigarettes a day for 1 year.  Yearly screening should continue until it has been 15 years since you quit.  Yearly screening should stop if you develop a health problem that would prevent you from having lung cancer treatment.  Breast Cancer  Practice breast self-awareness. This means understanding how your breasts normally appear and feel.  It also means doing regular breast self-exams. Let your health care provider know about any changes, no matter how small.  If you are in your 20s or 30s, you should  have a clinical breast exam (CBE) by a health care provider every 1-3 years as part of a regular health exam.  If you are 59 or older, have a CBE every year. Also consider having a breast X-ray (mammogram) every year.  If you have a family history of breast cancer, talk to your health care provider about genetic screening.  If you are at high risk for breast cancer, talk to your health care provider about having an MRI and a mammogram every year.  Breast cancer gene (BRCA) assessment is recommended for women who have family members with BRCA-related cancers. BRCA-related cancers  include:  Breast.  Ovarian.  Tubal.  Peritoneal cancers.  Results of the assessment will determine the need for genetic counseling and BRCA1 and BRCA2 testing. Cervical Cancer Your health care provider may recommend that you be screened regularly for cancer of the pelvic organs (ovaries, uterus, and vagina). This screening involves a pelvic examination, including checking for microscopic changes to the surface of your cervix (Pap test). You may be encouraged to have this screening done every 3 years, beginning at age 30.  For women ages 50-65, health care providers may recommend pelvic exams and Pap testing every 3 years, or they may recommend the Pap and pelvic exam, combined with testing for human papilloma virus (HPV), every 5 years. Some types of HPV increase your risk of cervical cancer. Testing for HPV may also be done on women of any age with unclear Pap test results.  Other health care providers may not recommend any screening for nonpregnant women who are considered low risk for pelvic cancer and who do not have symptoms. Ask your health care provider if a screening pelvic exam is right for you.  If you have had past treatment for cervical cancer or a condition that could lead to cancer, you need Pap tests and screening for cancer for at least 20 years after your treatment. If Pap tests have been discontinued, your risk factors (such as having a new sexual partner) need to be reassessed to determine if screening should resume. Some women have medical problems that increase the chance of getting cervical cancer. In these cases, your health care provider may recommend more frequent screening and Pap tests. Colorectal Cancer  This type of cancer can be detected and often prevented.  Routine colorectal cancer screening usually begins at 71 years of age and continues through 71 years of age.  Your health care provider may recommend screening at an earlier age if you have risk factors for  colon cancer.  Your health care provider may also recommend using home test kits to check for hidden blood in the stool.  A small camera at the end of a tube can be used to examine your colon directly (sigmoidoscopy or colonoscopy). This is done to check for the earliest forms of colorectal cancer.  Routine screening usually begins at age 55.  Direct examination of the colon should be repeated every 5-10 years through 71 years of age. However, you may need to be screened more often if early forms of precancerous polyps or small growths are found. Skin Cancer  Check your skin from head to toe regularly.  Tell your health care provider about any new moles or changes in moles, especially if there is a change in a mole's shape or color.  Also tell your health care provider if you have a mole that is larger than the size of a pencil eraser.  Always use sunscreen. Apply  sunscreen liberally and repeatedly throughout the day.  Protect yourself by wearing long sleeves, pants, a wide-brimmed hat, and sunglasses whenever you are outside. HEART DISEASE, DIABETES, AND HIGH BLOOD PRESSURE   High blood pressure causes heart disease and increases the risk of stroke. High blood pressure is more likely to develop in:  People who have blood pressure in the high end of the normal range (130-139/85-89 mm Hg).  People who are overweight or obese.  People who are African American.  If you are 10-33 years of age, have your blood pressure checked every 3-5 years. If you are 58 years of age or older, have your blood pressure checked every year. You should have your blood pressure measured twice--once when you are at a hospital or clinic, and once when you are not at a hospital or clinic. Record the average of the two measurements. To check your blood pressure when you are not at a hospital or clinic, you can use:  An automated blood pressure machine at a pharmacy.  A home blood pressure monitor.  If you  are between 73 years and 22 years old, ask your health care provider if you should take aspirin to prevent strokes.  Have regular diabetes screenings. This involves taking a blood sample to check your fasting blood sugar level.  If you are at a normal weight and have a low risk for diabetes, have this test once every three years after 71 years of age.  If you are overweight and have a high risk for diabetes, consider being tested at a younger age or more often. PREVENTING INFECTION  Hepatitis B  If you have a higher risk for hepatitis B, you should be screened for this virus. You are considered at high risk for hepatitis B if:  You were born in a country where hepatitis B is common. Ask your health care provider which countries are considered high risk.  Your parents were born in a high-risk country, and you have not been immunized against hepatitis B (hepatitis B vaccine).  You have HIV or AIDS.  You use needles to inject street drugs.  You live with someone who has hepatitis B.  You have had sex with someone who has hepatitis B.  You get hemodialysis treatment.  You take certain medicines for conditions, including cancer, organ transplantation, and autoimmune conditions. Hepatitis C  Blood testing is recommended for:  Everyone born from [birth year] through 1966.  Anyone with known risk factors for hepatitis C. Sexually transmitted infections (STIs)  You should be screened for sexually transmitted infections (STIs) including gonorrhea and chlamydia if:  You are sexually active and are younger than 71 years of age.  You are older than 71 years of age and your health care provider tells you that you are at risk for this type of infection.  Your sexual activity has changed since you were last screened and you are at an increased risk for chlamydia or gonorrhea. Ask your health care provider if you are at risk.  If you do not have HIV, but are at risk, it may be recommended that you  take a prescription medicine daily to prevent HIV infection. This is called pre-exposure prophylaxis (PrEP). You are considered at risk if:  You are sexually active and do not regularly use condoms or know the HIV status of your partner(s).  You take drugs by injection.  You are sexually active with a partner who has HIV. Talk with your health care provider about whether  you are at high risk of being infected with HIV. If you choose to begin PrEP, you should first be tested for HIV. You should then be tested every 3 months for as long as you are taking PrEP.  PREGNANCY   If you are premenopausal and you may become pregnant, ask your health care provider about preconception counseling.  If you may become pregnant, take 400 to 800 micrograms (mcg) of folic acid every day.  If you want to prevent pregnancy, talk to your health care provider about birth control (contraception). OSTEOPOROSIS AND MENOPAUSE   Osteoporosis is a disease in which the bones lose minerals and strength with aging. This can result in serious bone fractures. Your risk for osteoporosis can be identified using a bone density scan.  If you are 6 years of age or older, or if you are at risk for osteoporosis and fractures, ask your health care provider if you should be screened.  Ask your health care provider whether you should take a calcium or vitamin D supplement to lower your risk for osteoporosis.  Menopause may have certain physical symptoms and risks.  Hormone replacement therapy may reduce some of these symptoms and risks. Talk to your health care provider about whether hormone replacement therapy is right for you.  HOME CARE INSTRUCTIONS   Schedule regular health, dental, and eye exams.  Stay current with your immunizations.   Do not use any tobacco products including cigarettes, chewing tobacco, or electronic cigarettes.  If you are pregnant, do not drink alcohol.  If you are breastfeeding, limit how  much and how often you drink alcohol.  Limit alcohol intake to no more than 1 drink per day for nonpregnant women. One drink equals 12 ounces of beer, 5 ounces of wine, or 1 ounces of hard liquor.  Do not use street drugs.  Do not share needles.  Ask your health care provider for help if you need support or information about quitting drugs.  Tell your health care provider if you often feel depressed.  Tell your health care provider if you have ever been abused or do not feel safe at home.   This information is not intended to replace advice given to you by your health care provider. Make sure you discuss any questions you have with your health care provider.   Document Released: 02/20/2011 Document Revised: 08/28/2014 Document Reviewed: 07/09/2013 Elsevier Interactive Patient Education [employer].

## 2016-05-19 NOTE — Assessment & Plan Note (Signed)
BP at goal on hctz and losartan. Checking CMP and adjust as needed.  

## 2016-05-19 NOTE — Progress Notes (Signed)
Pre visit review using our clinic review tool, if applicable. No additional management support is needed unless otherwise documented below in the visit note. 

## 2016-05-19 NOTE — Progress Notes (Signed)
   Subjective:    Patient ID: Wendy Yates, female    DOB: 04/26/1946, 70 y.o.   MRN: [id number]  HPI Here for medicare wellness and CPE, no new complaints. Please see A/P for status and treatment of chronic medical problems.   Diet: heart healthy Physical activity: sedentary Depression/mood screen: negative Hearing: intact to whispered voice Visual acuity: grossly normal, performs annual eye exam  ADLs: capable Fall risk: none Home safety: good Cognitive evaluation: intact to orientation, naming, recall and repetition EOL planning: adv directives discussed  I have personally reviewed and have noted 1. The patient's medical and social history - reviewed today no changes 2. Their use of alcohol, tobacco or illicit drugs 3. Their current medications and supplements 4. The patient's functional ability including ADL's, fall risks, home safety risks and hearing or visual impairment. 5. Diet and physical activities 6. Evidence for depression or mood disorders 7. Care team reviewed and updated (available in snapshot)  Review of Systems  Constitutional: Negative for activity change, appetite change, chills, fatigue, fever and unexpected weight change.  HENT: Negative for congestion, postnasal drip, rhinorrhea, sinus pressure and sore throat.   Eyes: Negative.   Respiratory: Negative for cough, chest tightness, shortness of breath and wheezing.   Cardiovascular: Negative for chest pain, palpitations and leg swelling.  Gastrointestinal: Negative for abdominal distention, abdominal pain, constipation, diarrhea and nausea.  Musculoskeletal: Negative.   Skin: Negative.   Neurological: Negative.   Psychiatric/Behavioral: Negative.       Objective:   Physical Exam  Constitutional: She is oriented to person, place, and time. She appears well-developed and well-nourished.  HENT:  Head: Normocephalic and atraumatic.  Nose: Nose normal.  Mouth/Throat: Oropharynx is clear and moist.    Eyes: EOM are normal.  Neck: Normal range of motion.  Cardiovascular: Normal rate and regular rhythm.   Pulmonary/Chest: Effort normal and breath sounds normal. No respiratory distress. She has no wheezes. She has no rales. She exhibits no tenderness.  Abdominal: Soft. She exhibits no distension. There is no tenderness. There is no rebound.  Musculoskeletal: She exhibits no edema.  Neurological: She is alert and oriented to person, place, and time.  Skin: Skin is warm and dry.  Psychiatric: She has a normal mood and affect.   Vitals:   05/19/16 0855  BP: 132/86  Pulse: 75  Resp: 12  Temp: 98.5 F (36.9 C)  TempSrc: Oral  SpO2: 93%  Weight: 184 lb 6.4 oz (83.6 kg)  Height: 5' (1.524 m)      Assessment & Plan:  Flu shot given at visit.

## 2016-07-22 ENCOUNTER — Encounter: Payer: Self-pay | Admitting: Internal Medicine

## 2016-07-22 ENCOUNTER — Other Ambulatory Visit: Payer: Self-pay | Admitting: Internal Medicine

## 2016-07-24 ENCOUNTER — Other Ambulatory Visit: Payer: Self-pay | Admitting: Geriatric Medicine

## 2016-07-24 MED ORDER — BUSPIRONE HCL 15 MG PO TABS: 15.0000 mg | ORAL_TABLET | Freq: Two times a day (BID) | ORAL | 3 refills | Status: DC

## 2016-08-06 ENCOUNTER — Other Ambulatory Visit: Payer: Self-pay | Admitting: Internal Medicine

## 2016-12-18 ENCOUNTER — Encounter: Payer: Self-pay | Admitting: Internal Medicine

## 2017-01-15 ENCOUNTER — Encounter: Payer: Self-pay | Admitting: Internal Medicine

## 2017-01-31 ENCOUNTER — Other Ambulatory Visit: Payer: Self-pay | Admitting: Internal Medicine

## 2017-05-11 ENCOUNTER — Other Ambulatory Visit: Payer: Self-pay | Admitting: Internal Medicine

## 2017-05-11 DIAGNOSIS — Z1231 Encounter for screening mammogram for malignant neoplasm of breast: Secondary | ICD-10-CM

## 2017-05-29 ENCOUNTER — Other Ambulatory Visit: Payer: Self-pay | Admitting: Internal Medicine

## 2017-05-30 ENCOUNTER — Ambulatory Visit
Admission: RE | Admit: 2017-05-30 | Discharge: 2017-05-30 | Disposition: A | Discharge status: Home / Self Care | Payer: Medicare Other | Source: Ambulatory Visit | Attending: Internal Medicine | Admitting: Internal Medicine

## 2017-05-30 DIAGNOSIS — Z1231 Encounter for screening mammogram for malignant neoplasm of breast: Secondary | ICD-10-CM

## 2017-05-30 MED ORDER — ALBUTEROL SULFATE HFA 108 (90 BASE) MCG/ACT IN AERS: 2.0000 | INHALATION_SPRAY | Freq: Four times a day (QID) | RESPIRATORY_TRACT | 5 refills | Status: DC | PRN

## 2017-06-14 ENCOUNTER — Encounter: Payer: Self-pay | Admitting: Internal Medicine

## 2017-06-14 ENCOUNTER — Ambulatory Visit (INDEPENDENT_AMBULATORY_CARE_PROVIDER_SITE_OTHER): Payer: Medicare Other | Admitting: Internal Medicine

## 2017-06-14 ENCOUNTER — Other Ambulatory Visit (INDEPENDENT_AMBULATORY_CARE_PROVIDER_SITE_OTHER): Payer: Medicare Other

## 2017-06-14 VITALS — BP 138/80 | HR 74 | Temp 97.8°F | Ht 60.0 in | Wt 190.0 lb

## 2017-06-14 DIAGNOSIS — Z23 Encounter for immunization: Secondary | ICD-10-CM

## 2017-06-14 DIAGNOSIS — J4520 Mild intermittent asthma, uncomplicated: Secondary | ICD-10-CM

## 2017-06-14 DIAGNOSIS — E785 Hyperlipidemia, unspecified: Secondary | ICD-10-CM

## 2017-06-14 DIAGNOSIS — Z0000 Encounter for general adult medical examination without abnormal findings: Secondary | ICD-10-CM

## 2017-06-14 DIAGNOSIS — F324 Major depressive disorder, single episode, in partial remission: Secondary | ICD-10-CM | POA: Diagnosis not present

## 2017-06-14 DIAGNOSIS — I10 Essential (primary) hypertension: Secondary | ICD-10-CM | POA: Diagnosis not present

## 2017-06-14 DIAGNOSIS — G14 Postpolio syndrome: Secondary | ICD-10-CM

## 2017-06-14 LAB — COMPREHENSIVE METABOLIC PANEL
ALT: 14 U/L (ref 0–35)
AST: 28 U/L (ref 0–37)
Albumin: 4.3 g/dL (ref 3.5–5.2)
Alkaline Phosphatase: 93 U/L (ref 39–117)
BUN: 14 mg/dL (ref 6–23)
CALCIUM: 9.5 mg/dL (ref 8.4–10.5)
CO2: 30 meq/L (ref 19–32)
Chloride: 100 mEq/L (ref 96–112)
Creatinine, Ser: 0.55 mg/dL (ref 0.40–1.20)
GFR: 115.5 mL/min (ref >60.00)
GLUCOSE: 96 mg/dL (ref 70–99)
POTASSIUM: 4 meq/L (ref 3.5–5.1)
SODIUM: 137 meq/L (ref 135–145)
Total Bilirubin: 1.1 mg/dL (ref 0.2–1.2)
Total Protein: 7 g/dL (ref 6.0–8.3)

## 2017-06-14 LAB — CBC
HEMATOCRIT: 43 % (ref 36.0–46.0)
Hemoglobin: 14.1 g/dL (ref 12.0–15.0)
MCHC: 32.9 g/dL (ref 30.0–36.0)
MCV: 93.1 fl (ref 78.0–100.0)
PLATELETS: 318 10*3/uL (ref 150.0–400.0)
RBC: 4.62 Mil/uL (ref 3.87–5.11)
RDW: 14.1 % (ref 11.5–15.5)
WBC: 3.2 10*3/uL — ABNORMAL LOW (ref 4.0–10.5)

## 2017-06-14 LAB — LIPID PANEL
CHOL/HDL RATIO: 3
Cholesterol: 136 mg/dL (ref 0–200)
HDL: 42.1 mg/dL (ref >39.00)
LDL Cholesterol: 67 mg/dL (ref 0–99)
NONHDL: 93.84
Triglycerides: 135 mg/dL (ref 0.0–149.0)
VLDL: 27 mg/dL (ref 0.0–40.0)

## 2017-06-14 LAB — HEMOGLOBIN A1C: Hgb A1c MFr Bld: 6 % (ref 4.6–6.5)

## 2017-06-14 NOTE — Assessment & Plan Note (Signed)
Does well when she can get her symbicort. Due to cost she does not take all the time. Albuterol prn. No flare today.

## 2017-06-14 NOTE — Assessment & Plan Note (Signed)
Checking lipid panel and adjust lipitor daily if needed.

## 2017-06-14 NOTE — Progress Notes (Signed)
   Subjective:    Patient ID: Wendy Yates, female    DOB: [birth year], 71 y.o.   MRN: [id number]  HPI Here for medicare wellness and physical, no new complaints. Please see A/P for status and treatment of chronic medical problems.   Diet: heart healthy  Physical activity: sedentary Depression/mood screen: negative Hearing: intact to whispered voice Visual acuity: grossly normal, performs annual eye exam  ADLs: capable Fall risk: none Home safety: good Cognitive evaluation: intact to orientation, naming, recall and repetition EOL planning: adv directives discussed  I have personally reviewed and have noted 1. The patient's medical and social history - reviewed today no changes 2. Their use of alcohol, tobacco or illicit drugs 3. Their current medications and supplements 4. The patient's functional ability including ADL's, fall risks, home safety risks and hearing or visual impairment. 5. Diet and physical activities 6. Evidence for depression or mood disorders 7. Care team reviewed and updated (available in snapshot)  Review of Systems  Constitutional: Negative.   HENT: Negative.   Eyes: Negative.   Respiratory: Negative for cough, chest tightness and shortness of breath.   Cardiovascular: Negative for chest pain, palpitations and leg swelling.  Gastrointestinal: Negative for abdominal distention, abdominal pain, constipation, diarrhea, nausea and vomiting.  Musculoskeletal: Positive for arthralgias.  Skin: Negative.   Neurological: Negative.   Psychiatric/Behavioral: Negative.       Objective:   Physical Exam  Constitutional: She is oriented to person, place, and time. She appears well-developed and well-nourished.  overweight  HENT:  Head: Normocephalic and atraumatic.  Eyes: EOM are normal.  Neck: Normal range of motion.  Cardiovascular: Normal rate and regular rhythm.   Pulmonary/Chest: Effort normal and breath sounds normal. No respiratory distress. She has no  wheezes. She has no rales.  Abdominal: Soft. Bowel sounds are normal. She exhibits no distension. There is no tenderness. There is no rebound.  Musculoskeletal: She exhibits no edema.  Neurological: She is alert and oriented to person, place, and time. Coordination abnormal.  Walker for ambulation  Skin: Skin is warm and dry.  Psychiatric: She has a normal mood and affect.    Vitals:   06/14/17 0908 06/14/17 0948  BP: (!) 150/90 138/80  Pulse: 74   Temp: 97.8 F (36.6 C)   TempSrc: Oral   SpO2: 98%   Weight: 190 lb (86.2 kg)   Height: 5' (1.524 m)       Assessment & Plan:  Flu shot given at visit

## 2017-06-14 NOTE — Assessment & Plan Note (Signed)
Stable with gait problems which are well maintained with exercise and walker.

## 2017-06-14 NOTE — Assessment & Plan Note (Signed)
Controlled with zoloft and buspar prn.

## 2017-06-14 NOTE — Assessment & Plan Note (Signed)
Flu shot given at visit, tetanus and pneumonia up to date. Counseled about shingrix. Counseled about sun safety and mole surveillance. Counseled about the dangers of distracted driving. Given 10 year screening recommendations.

## 2017-06-14 NOTE — Assessment & Plan Note (Signed)
BP at goal on hctz losartan. Checking CMP and adjust as needed.

## 2017-06-14 NOTE — Patient Instructions (Signed)
We will check the labs today and call you back about the results.   Health Maintenance, Female Adopting a healthy lifestyle and getting preventive care can go a long way to promote health and wellness. Talk with your health care provider about what schedule of regular examinations is right for you. This is a good chance for you to check in with your provider about disease prevention and staying healthy. In between checkups, there are plenty of things you can do on your own. Experts have done a lot of research about which lifestyle changes and preventive measures are most likely to keep you healthy. Ask your health care provider for more information. Weight and diet Eat a healthy diet  Be sure to include plenty of vegetables, fruits, low-fat dairy products, and lean protein.  Do not eat a lot of foods high in solid fats, added sugars, or salt.  Get regular exercise. This is one of the most important things you can do for your health. ? Most adults should exercise for at least 150 minutes each week. The exercise should increase your heart rate and make you sweat (moderate-intensity exercise). ? Most adults should also do strengthening exercises at least twice a week. This is in addition to the moderate-intensity exercise.  Maintain a healthy weight  Body mass index (BMI) is a measurement that can be used to identify possible weight problems. It estimates body fat based on height and weight. Your health care provider can help determine your BMI and help you achieve or maintain a healthy weight.  For females 20 years of age and older: ? A BMI below 18.5 is considered underweight. ? A BMI of 18.5 to 24.9 is normal. ? A BMI of 25 to 29.9 is considered overweight. ? A BMI of 30 and above is considered obese.  Watch levels of cholesterol and blood lipids  You should start having your blood tested for lipids and cholesterol at 72 years of age, then have this test every 5 years.  You may need to  have your cholesterol levels checked more often if: ? Your lipid or cholesterol levels are high. ? You are older than 72 years of age. ? You are at high risk for heart disease.  Cancer screening Lung Cancer  Lung cancer screening is recommended for adults 55-80 years old who are at high risk for lung cancer because of a history of smoking.  A yearly low-dose CT scan of the lungs is recommended for people who: ? Currently smoke. ? Have quit within the past 15 years. ? Have at least a 30-pack-year history of smoking. A pack year is smoking an average of one pack of cigarettes a day for 1 year.  Yearly screening should continue until it has been 15 years since you quit.  Yearly screening should stop if you develop a health problem that would prevent you from having lung cancer treatment.  Breast Cancer  Practice breast self-awareness. This means understanding how your breasts normally appear and feel.  It also means doing regular breast self-exams. Let your health care provider know about any changes, no matter how small.  If you are in your 20s or 30s, you should have a clinical breast exam (CBE) by a health care provider every 1-3 years as part of a regular health exam.  If you are 40 or older, have a CBE every year. Also consider having a breast X-ray (mammogram) every year.  If you have a family history of breast cancer, talk   to your health care provider about genetic screening.  If you are at high risk for breast cancer, talk to your health care provider about having an MRI and a mammogram every year.  Breast cancer gene (BRCA) assessment is recommended for women who have family members with BRCA-related cancers. BRCA-related cancers include: ? Breast. ? Ovarian. ? Tubal. ? Peritoneal cancers.  Results of the assessment will determine the need for genetic counseling and BRCA1 and BRCA2 testing.  Cervical Cancer Your health care provider may recommend that you be screened  regularly for cancer of the pelvic organs (ovaries, uterus, and vagina). This screening involves a pelvic examination, including checking for microscopic changes to the surface of your cervix (Pap test). You may be encouraged to have this screening done every 3 years, beginning at age 21.  For women ages 30-65, health care providers may recommend pelvic exams and Pap testing every 3 years, or they may recommend the Pap and pelvic exam, combined with testing for human papilloma virus (HPV), every 5 years. Some types of HPV increase your risk of cervical cancer. Testing for HPV may also be done on women of any age with unclear Pap test results.  Other health care providers may not recommend any screening for nonpregnant women who are considered low risk for pelvic cancer and who do not have symptoms. Ask your health care provider if a screening pelvic exam is right for you.  If you have had past treatment for cervical cancer or a condition that could lead to cancer, you need Pap tests and screening for cancer for at least 20 years after your treatment. If Pap tests have been discontinued, your risk factors (such as having a new sexual partner) need to be reassessed to determine if screening should resume. Some women have medical problems that increase the chance of getting cervical cancer. In these cases, your health care provider may recommend more frequent screening and Pap tests.  Colorectal Cancer  This type of cancer can be detected and often prevented.  Routine colorectal cancer screening usually begins at 72 years of age and continues through 72 years of age.  Your health care provider may recommend screening at an earlier age if you have risk factors for colon cancer.  Your health care provider may also recommend using home test kits to check for hidden blood in the stool.  A small camera at the end of a tube can be used to examine your colon directly (sigmoidoscopy or colonoscopy). This is  done to check for the earliest forms of colorectal cancer.  Routine screening usually begins at age 50.  Direct examination of the colon should be repeated every 5-10 years through 72 years of age. However, you may need to be screened more often if early forms of precancerous polyps or small growths are found.  Skin Cancer  Check your skin from head to toe regularly.  Tell your health care provider about any new moles or changes in moles, especially if there is a change in a mole's shape or color.  Also tell your health care provider if you have a mole that is larger than the size of a pencil eraser.  Always use sunscreen. Apply sunscreen liberally and repeatedly throughout the day.  Protect yourself by wearing long sleeves, pants, a wide-brimmed hat, and sunglasses whenever you are outside.  Heart disease, diabetes, and high blood pressure  High blood pressure causes heart disease and increases the risk of stroke. High blood pressure is more   likely to develop in: ? People who have blood pressure in the high end of the normal range (130-139/85-89 mm Hg). ? People who are overweight or obese. ? People who are African American.  If you are 90-95 years of age, have your blood pressure checked every 3-5 years. If you are 4 years of age or older, have your blood pressure checked every year. You should have your blood pressure measured twice-once when you are at a hospital or clinic, and once when you are not at a hospital or clinic. Record the average of the two measurements. To check your blood pressure when you are not at a hospital or clinic, you can use: ? An automated blood pressure machine at a pharmacy. ? A home blood pressure monitor.  If you are between 44 years and 21 years old, ask your health care provider if you should take aspirin to prevent strokes.  Have regular diabetes screenings. This involves taking a blood sample to check your fasting blood sugar level. ? If you are  at a normal weight and have a low risk for diabetes, have this test once every three years after 72 years of age. ? If you are overweight and have a high risk for diabetes, consider being tested at a younger age or more often. Preventing infection Hepatitis B  If you have a higher risk for hepatitis B, you should be screened for this virus. You are considered at high risk for hepatitis B if: ? You were born in a country where hepatitis B is common. Ask your health care provider which countries are considered high risk. ? Your parents were born in a high-risk country, and you have not been immunized against hepatitis B (hepatitis B vaccine). ? You have HIV or AIDS. ? You use needles to inject street drugs. ? You live with someone who has hepatitis B. ? You have had sex with someone who has hepatitis B. ? You get hemodialysis treatment. ? You take certain medicines for conditions, including cancer, organ transplantation, and autoimmune conditions.  Hepatitis C  Blood testing is recommended for: ? Everyone born from [birth year] through 1966. ? Anyone with known risk factors for hepatitis C.  Sexually transmitted infections (STIs)  You should be screened for sexually transmitted infections (STIs) including gonorrhea and chlamydia if: ? You are sexually active and are younger than 72 years of age. ? You are older than 72 years of age and your health care provider tells you that you are at risk for this type of infection. ? Your sexual activity has changed since you were last screened and you are at an increased risk for chlamydia or gonorrhea. Ask your health care provider if you are at risk.  If you do not have HIV, but are at risk, it may be recommended that you take a prescription medicine daily to prevent HIV infection. This is called pre-exposure prophylaxis (PrEP). You are considered at risk if: ? You are sexually active and do not regularly use condoms or know the HIV status of your  partner(s). ? You take drugs by injection. ? You are sexually active with a partner who has HIV.  Talk with your health care provider about whether you are at high risk of being infected with HIV. If you choose to begin PrEP, you should first be tested for HIV. You should then be tested every 3 months for as long as you are taking PrEP. Pregnancy  If you are premenopausal and you may  become pregnant, ask your health care provider about preconception counseling.  If you may become pregnant, take 400 to 800 micrograms (mcg) of folic acid every day.  If you want to prevent pregnancy, talk to your health care provider about birth control (contraception). Osteoporosis and menopause  Osteoporosis is a disease in which the bones lose minerals and strength with aging. This can result in serious bone fractures. Your risk for osteoporosis can be identified using a bone density scan.  If you are 40 years of age or older, or if you are at risk for osteoporosis and fractures, ask your health care provider if you should be screened.  Ask your health care provider whether you should take a calcium or vitamin D supplement to lower your risk for osteoporosis.  Menopause may have certain physical symptoms and risks.  Hormone replacement therapy may reduce some of these symptoms and risks. Talk to your health care provider about whether hormone replacement therapy is right for you. Follow these instructions at home:  Schedule regular health, dental, and eye exams.  Stay current with your immunizations.  Do not use any tobacco products including cigarettes, chewing tobacco, or electronic cigarettes.  If you are pregnant, do not drink alcohol.  If you are breastfeeding, limit how much and how often you drink alcohol.  Limit alcohol intake to no more than 1 drink per day for nonpregnant women. One drink equals 12 ounces of beer, 5 ounces of wine, or 1 ounces of hard liquor.  Do not use street  drugs.  Do not share needles.  Ask your health care provider for help if you need support or information about quitting drugs.  Tell your health care provider if you often feel depressed.  Tell your health care provider if you have ever been abused or do not feel safe at home. This information is not intended to replace advice given to you by your health care provider. Make sure you discuss any questions you have with your health care provider. Document Released: 02/20/2011 Document Revised: 01/13/2016 Document Reviewed: 05/11/2015 Elsevier Interactive Patient Education  [employer].

## 2017-06-15 ENCOUNTER — Other Ambulatory Visit: Payer: Self-pay | Admitting: Internal Medicine

## 2017-06-27 ENCOUNTER — Other Ambulatory Visit: Payer: Self-pay | Admitting: Internal Medicine

## 2017-07-13 ENCOUNTER — Other Ambulatory Visit: Payer: Self-pay | Admitting: Internal Medicine

## 2017-08-31 ENCOUNTER — Other Ambulatory Visit: Payer: Self-pay | Admitting: Internal Medicine

## 2017-09-26 ENCOUNTER — Ambulatory Visit (INDEPENDENT_AMBULATORY_CARE_PROVIDER_SITE_OTHER): Payer: Medicare Other | Admitting: Internal Medicine

## 2017-09-26 ENCOUNTER — Encounter: Payer: Self-pay | Admitting: Internal Medicine

## 2017-09-26 DIAGNOSIS — J4521 Mild intermittent asthma with (acute) exacerbation: Secondary | ICD-10-CM | POA: Diagnosis not present

## 2017-09-26 MED ORDER — PREDNISONE 20 MG PO TABS: 40.0000 mg | ORAL_TABLET | Freq: Every day | ORAL | 0 refills | Status: DC

## 2017-09-26 NOTE — Assessment & Plan Note (Signed)
With flare today, rx for prednisone 1 week course. If no improvement in 2 days will add antibiotic although likely trigger allergen or viral.

## 2017-09-26 NOTE — Progress Notes (Signed)
   Subjective:    Patient ID: Wendy Yates, female    DOB: 10-22-1945, 72 y.o.   MRN: [id number]  HPI The patient is a 72 YO female coming in for SOB and cough. She was outside on Monday and symptoms started shortly thereafter. She is using albuterol and nebulizer every hour yesterday due to tightness and SOB. She is using symbicort still. She is using allegra and even added xyzal but this is not helping. She is having some ear pain, nose drainage. Denies fevers but some chills. She denies sick contacts. She rarely has asthma flares but they can be severe.   Review of Systems  Constitutional: Positive for activity change, appetite change and chills. Negative for fatigue, fever and unexpected weight change.  HENT: Positive for congestion, ear pain, postnasal drip, rhinorrhea and sinus pressure. Negative for ear discharge, sinus pain, sneezing, sore throat, tinnitus, trouble swallowing and voice change.   Eyes: Negative.   Respiratory: Positive for cough, shortness of breath and wheezing. Negative for chest tightness.   Cardiovascular: Negative.   Gastrointestinal: Negative.   Neurological: Negative.       Objective:   Physical Exam  Constitutional: She is oriented to person, place, and time. She appears well-developed and well-nourished.  HENT:  Head: Normocephalic and atraumatic.  Oropharynx with redness and clear drainage, nose with swollen turbinates, TMs normal bilaterally  Eyes: EOM are normal.  Neck: Normal range of motion. No thyromegaly present.  Cardiovascular: Normal rate and regular rhythm.  Pulmonary/Chest: Effort normal. No respiratory distress. She has wheezes. She has no rales.  Lungs sound tight with some mild wheezing  Abdominal: Soft.  Musculoskeletal: She exhibits tenderness.  Lymphadenopathy:    She has no cervical adenopathy.  Neurological: She is alert and oriented to person, place, and time.  Skin: Skin is warm and dry.   Vitals:   09/26/17 1108  BP: (!)  150/100  Pulse: 78  Temp: (!) 97.5 F (36.4 C)  TempSrc: Oral  SpO2: 98%  Weight: 191 lb (86.6 kg)  Height: 5' (1.524 m)      Assessment & Plan:

## 2017-09-26 NOTE — Patient Instructions (Addendum)
We have sent in the prednisone to help the breathing. Take 2 pills daily for 1 week. Keep using the nebulizers as well.   If you are not feeling better tomorrow or Friday call us and we will send in an antibiotic as well.

## 2017-09-28 ENCOUNTER — Telehealth: Payer: Self-pay | Admitting: Internal Medicine

## 2017-09-28 MED ORDER — DOXYCYCLINE HYCLATE 100 MG PO TABS: 100.0000 mg | ORAL_TABLET | Freq: Two times a day (BID) | ORAL | 0 refills | Status: DC

## 2017-09-28 NOTE — Telephone Encounter (Signed)
Sent in doxycycline. Realized afterwards [facility] was [facility]. Can you resend to local and call and cancel with [facility]?

## 2017-09-28 NOTE — Telephone Encounter (Signed)
Copied from CRM [phone]. Topic: Quick Communication - See Telephone Encounter >> Sep 28, 2017 11:22 AM Floria RavelingStovall, Shana A wrote: CRM for notification. See Telephone encounter for: pt called in and said that she is not any better.  Dr Okey Duprecrawford told her to call in if she was not feeling any better.  She would like antibiotic called into:  Pharmacy -[facility] [address]   She would like a call when this has been called in  Best number [phone]   09/28/17.

## 2017-10-26 ENCOUNTER — Other Ambulatory Visit: Payer: Self-pay

## 2017-10-26 ENCOUNTER — Encounter: Payer: Self-pay | Admitting: Internal Medicine

## 2017-10-26 MED ORDER — BUSPIRONE HCL 15 MG PO TABS: 15.0000 mg | ORAL_TABLET | Freq: Two times a day (BID) | ORAL | 2 refills | Status: DC

## 2017-11-07 DIAGNOSIS — H2513 Age-related nuclear cataract, bilateral: Secondary | ICD-10-CM | POA: Diagnosis not present

## 2017-11-07 DIAGNOSIS — H524 Presbyopia: Secondary | ICD-10-CM | POA: Diagnosis not present

## 2017-12-25 NOTE — Progress Notes (Signed)
Subjective:    Patient ID: Wendy Yates, female    DOB: 02-Jan-1946, 72 y.o.   MRN: [id number]  HPI The patient is here for an acute visit.  Shoulder pain:  Left shoulder and arm pain.  Her pain started in march.  She denies any injury or accident.  She does exercise regularly and was doing weights routinely.  She noticed that her left arm felt aggravated after exercise class and she started decreasing the amount of weights she was using, but was still having discomfort.  She has a constant dull pain and intermittent shooting pain in her left shoulder and arm daily.  If she moves her arm or tries to pick something up the pain is worse.  She is left-handed.  She thought there might be some mild swelling of her left shoulder, but was unsure.  She denies any skin changes.  She denies any numbness or tingling.  She has difficulty picking things up because of pain, but does not feel the arm is weak.    She does state some increased pain throughout her body.  She wonders if it could be the statin or from postpolio syndrome.  She has been on the statin for a while.  The medication does change sometimes on a monthly basis because she is on generic.      Medications and allergies reviewed with patient and updated if appropriate.  Patient Active Problem List   Diagnosis Date Noted  . Routine general medical examination at a health care facility 04/20/2015  . Hyperlipidemia 11/28/2011  . Essential hypertension   . Asthma, extrinsic   . Osteopenia   . Post-polio syndrome   . Depression   . Allergic rhinitis, cause unspecified   . Scoliosis     Current Outpatient Medications on File Prior to Visit  Medication Sig Dispense Refill  . albuterol (PROAIR HFA) 108 (90 Base) MCG/ACT inhaler Inhale 2 puffs into the lungs every 6 (six) hours as needed for wheezing or shortness of breath. 8.5 g 5  . albuterol (PROVENTIL) (2.5 MG/3ML) 0.083% nebulizer solution Take 3 mLs (2.5 mg total) by  nebulization 4 (four) times daily as needed. 75 mL 3  . atorvastatin (LIPITOR) 10 MG tablet TAKE 1 TABLET BY MOUTH  DAILY 90 tablet 3  . busPIRone (BUSPAR) 15 MG tablet Take 1 tablet (15 mg total) by mouth 2 (two) times daily. 180 tablet 2  . Calcium Carbonate-Vitamin D (CALTRATE 600+D) 600-400 MG-UNIT per tablet Take 1 tablet by mouth 2 (two) times daily.    . Cyanocobalamin (B-12) 2500 MCG TABS Take by mouth daily.    Marland Kitchen doxycycline (VIBRA-TABS) 100 MG tablet Take 1 tablet (100 mg total) by mouth 2 (two) times daily. 14 tablet 0  . fexofenadine (ALLEGRA) 180 MG tablet Take 1 tablet (180 mg total) by mouth daily.    . hydrochlorothiazide (HYDRODIURIL) 25 MG tablet TAKE 1 TABLET BY MOUTH  DAILY (Patient taking differently: TAKE 1 TABLET BY MOUTH  DAILY  taking 3 days a week) 90 tablet 3  . losartan (COZAAR) 100 MG tablet TAKE 1 TABLET BY MOUTH  DAILY 90 tablet 3  . montelukast (SINGULAIR) 10 MG tablet TAKE 1 TABLET BY MOUTH AT  BEDTIME 90 tablet 3  . Multiple Vitamin (DAILY MULTIVITAMIN PO) Take by mouth daily.    . naproxen sodium (ALEVE) 220 MG tablet Take 220 mg by mouth as needed.    Marland Kitchen omeprazole (PRILOSEC) 20 MG capsule TAKE 1 CAPSULE BY  MOUTH  DAILY 90 capsule 3  . predniSONE (DELTASONE) 20 MG tablet Take 2 tablets (40 mg total) by mouth daily with breakfast. 14 tablet 0  . sertraline (ZOLOFT) 50 MG tablet TAKE 1 TABLET BY MOUTH  DAILY 90 tablet 3  . SYMBICORT 80-4.5 MCG/ACT inhaler INL 2 PFS PO BID  0   No current facility-administered medications on file prior to visit.     Past Medical History:  Diagnosis Date  . Allergic rhinitis, cause unspecified   . Anxiety   . Asthma, extrinsic   . Depression   . Hyperlipidemia   . Hypertension   . Osteopenia    DEXA 02/2012: -2.0; 12/19/07: -2.1; 08/17/04: -1.6  . Post-polio syndrome    polio age 74,  RLE most affected  . Scoliosis    1979 harrington rod surgery  . Urine incontinence     Past Surgical History:  Procedure Laterality  Date  . BACK SURGERY  1979   harrington rod inserted  . BREAST BIOPSY    . CHOLECYSTECTOMY  1969  . Left hammertoe repair    . Multiple surgeries to allow her to walk post polio    . TONSILLECTOMY  1952  . TUBAL LIGATION      Social History   Socioeconomic History  . Marital status: Single    Spouse name: Not on file  . Number of children: 2  . Years of education: 54  . Highest education level: Not on file  Occupational History  . Not on file  Social Needs  . Financial resource strain: Not on file  . Food insecurity:    Worry: Not on file    Inability: Not on file  . Transportation needs:    Medical: Not on file    Non-medical: Not on file  Tobacco Use  . Smoking status: Former Smoker    Packs/day: 0.50    Years: 10.00    Pack years: 5.00    Types: Cigarettes    Last attempt to quit: 08/21/2009    Years since quitting: 8.3  . Smokeless tobacco: Never Used  Substance and Sexual Activity  . Alcohol use: Yes    Comment: maybe once a month per pt.  . Drug use: No  . Sexual activity: Not on file  Lifestyle  . Physical activity:    Days per week: Not on file    Minutes per session: Not on file  . Stress: Not on file  Relationships  . Social connections:    Talks on phone: Not on file    Gets together: Not on file    Attends religious service: Not on file    Active member of club or organization: Not on file    Attends meetings of clubs or organizations: Not on file    Relationship status: Not on file  Other Topics Concern  . Not on file  Social History Narrative   Single, lives alone - but parents and sister near by in town   Has electric WC within home, walks with cane   Denies abuse and feels safe at home.     Family History  Problem Relation Age of Onset  . Alcohol abuse Mother   . Arthritis Mother   . Arthritis Father   . Colon cancer Neg Hx     Review of Systems  Constitutional: Negative for fever.  Musculoskeletal: Positive for arthralgias and  joint swelling. Negative for neck pain and neck stiffness.  Skin: Negative for color change  and rash.       Objective:   Vitals:   12/26/17 1121  BP: (!) 150/80  Pulse: 91  Resp: 16  Temp: 97.9 F (36.6 C)  SpO2: 95%   BP Readings from Last 3 Encounters:  12/26/17 (!) 150/80  09/26/17 (!) 150/100  06/14/17 138/80   Wt Readings from Last 3 Encounters:  12/26/17 180 lb (81.6 kg)  09/26/17 191 lb (86.6 kg)  06/14/17 190 lb (86.2 kg)   Body mass index is 35.15 kg/m.   Physical Exam    A left Shoulder exam was performed.   SWELLING: none  EFFUSION: no  WARMTH: no warmth  TENDERNESS: tenderness with palpation of shoulder joint  ROM: full ROM but pain with movement in shoulder and arm  NEUROLOGICAL EXAM: normal sensation and strength  PULSES: normal  SKIN: normal, no rash      Assessment & Plan:    See Problem List for Assessment and Plan of chronic medical problems.

## 2017-12-26 ENCOUNTER — Ambulatory Visit (INDEPENDENT_AMBULATORY_CARE_PROVIDER_SITE_OTHER): Payer: Medicare Other | Admitting: Internal Medicine

## 2017-12-26 ENCOUNTER — Encounter: Payer: Self-pay | Admitting: Internal Medicine

## 2017-12-26 VITALS — BP 150/80 | HR 91 | Temp 97.9°F | Resp 16 | Wt 180.0 lb

## 2017-12-26 DIAGNOSIS — M25512 Pain in left shoulder: Secondary | ICD-10-CM | POA: Diagnosis not present

## 2017-12-26 DIAGNOSIS — M791 Myalgia, unspecified site: Secondary | ICD-10-CM | POA: Diagnosis not present

## 2017-12-26 DIAGNOSIS — S43432A Superior glenoid labrum lesion of left shoulder, initial encounter: Secondary | ICD-10-CM | POA: Insufficient documentation

## 2017-12-26 NOTE — Patient Instructions (Addendum)
See sports medicine doctor tomorrow for evaluation of your left arm and shoulder pain.     Continue ice/heat on the shoulder.  Continue aleve or advil as needed.   For your whole body pain try stopping your statin for one month.

## 2017-12-26 NOTE — Assessment & Plan Note (Signed)
She is complaining of increased whole body pain She is unsure if it is related to the statin or possibly postpolio syndrome Advised her to stop the statin for 1 month and if pain does not improve to restart it. Follow-up with PCP

## 2017-12-26 NOTE — Assessment & Plan Note (Signed)
Pain started in march No relief with over-the-counter pain medications, ice and heat Increased pain with movement in shoulder and left arm Will refer to sports medicine for further evaluation and treatment-can be seen tomorrow

## 2017-12-27 ENCOUNTER — Ambulatory Visit: Payer: Self-pay

## 2017-12-27 ENCOUNTER — Encounter: Payer: Self-pay | Admitting: Family Medicine

## 2017-12-27 ENCOUNTER — Ambulatory Visit (INDEPENDENT_AMBULATORY_CARE_PROVIDER_SITE_OTHER): Payer: Medicare Other | Admitting: Family Medicine

## 2017-12-27 VITALS — BP 124/68 | HR 68 | Ht 60.0 in | Wt 181.0 lb

## 2017-12-27 DIAGNOSIS — S43432A Superior glenoid labrum lesion of left shoulder, initial encounter: Secondary | ICD-10-CM

## 2017-12-27 DIAGNOSIS — M25512 Pain in left shoulder: Secondary | ICD-10-CM

## 2017-12-27 NOTE — Progress Notes (Signed)
Wendy Yates - 72 y.o. female MRN [id number]  Date of birth: 11/04/1945  SUBJECTIVE:  Including CC & ROS.  Chief Complaint  Patient presents with  . left shoulder pain    Wendy Yates is a 72 y.o. female that is presenting with left shoulder pain. She fell out of her bed in March and landed on her left arm. Pain is located posterior aspect of shoulder   She has been taking Aleve, Mobic, and using a TENS unit. She was exercising at [employer]. Activity exacerbates the pain. Flexion and extension worsen the pain. Admits to limited range of motion. Worse when she raises her arm. Pain is throbbing. Denies surgery. She uses a cane and a walker to ambulate daily.     Review of Systems  Constitutional: Negative for fever.  HENT: Negative for congestion.   Respiratory: Negative for cough.   Cardiovascular: Negative for chest pain.  Gastrointestinal: Negative for abdominal pain.    HISTORY: Past Medical, Surgical, Social, and Family History Reviewed & Updated per EMR.   Pertinent Historical Findings include:  Past Medical History:  Diagnosis Date  . Allergic rhinitis, cause unspecified   . Anxiety   . Asthma, extrinsic   . Depression   . Hyperlipidemia   . Hypertension   . Osteopenia    DEXA 02/2012: -2.0; 12/19/07: -2.1; 08/17/04: -1.6  . Post-polio syndrome    polio age 48,  RLE most affected  . Scoliosis    1979 harrington rod surgery  . Urine incontinence     Past Surgical History:  Procedure Laterality Date  . BACK SURGERY  1979   harrington rod inserted  . BREAST BIOPSY    . CHOLECYSTECTOMY  1969  . Left hammertoe repair    . Multiple surgeries to allow her to walk post polio    . TONSILLECTOMY  1952  . TUBAL LIGATION      Allergies  Allergen Reactions  . Morphine And Related Other (See Comments)    "crazy"  . Sulfa Antibiotics Swelling and Other (See Comments)    "swollen joints and extreme pain"    Family History  Problem Relation Age of Onset  .  Alcohol abuse Mother   . Arthritis Mother   . Arthritis Father   . Colon cancer Neg Hx      Social History   Socioeconomic History  . Marital status: Single    Spouse name: Not on file  . Number of children: 2  . Years of education: 91  . Highest education level: Not on file  Occupational History  . Not on file  Social Needs  . Financial resource strain: Not on file  . Food insecurity:    Worry: Not on file    Inability: Not on file  . Transportation needs:    Medical: Not on file    Non-medical: Not on file  Tobacco Use  . Smoking status: Former Smoker    Packs/day: 0.50    Years: 10.00    Pack years: 5.00    Types: Cigarettes    Last attempt to quit: 08/21/2009    Years since quitting: 8.3  . Smokeless tobacco: Never Used  Substance and Sexual Activity  . Alcohol use: Yes    Comment: maybe once a month per pt.  . Drug use: No  . Sexual activity: Not on file  Lifestyle  . Physical activity:    Days per week: Not on file    Minutes per session: Not  on file  . Stress: Not on file  Relationships  . Social connections:    Talks on phone: Not on file    Gets together: Not on file    Attends religious service: Not on file    Active member of club or organization: Not on file    Attends meetings of clubs or organizations: Not on file    Relationship status: Not on file  . Intimate partner violence:    Fear of current or ex partner: Not on file    Emotionally abused: Not on file    Physically abused: Not on file    Forced sexual activity: Not on file  Other Topics Concern  . Not on file  Social History Narrative   Single, lives alone - but parents and sister near by in town   Has electric WC within home, walks with cane   Denies abuse and feels safe at home.      PHYSICAL EXAM:  VS: BP 124/68 (BP Location: Left Arm, Patient Position: Sitting, Cuff Size: Normal)   Pulse 68   Ht 5' (1.524 m)   Wt 181 lb (82.1 kg)   SpO2 98%   BMI 35.35 kg/m  Physical  Exam Gen: NAD, alert, cooperative with exam, well-appearing ENT: normal lips, normal nasal mucosa,  Eye: normal EOM, normal conjunctiva and lids CV:  no edema, +2 pedal pulses   Resp: no accessory muscle use, non-labored,  GI: no masses or tenderness, no hernia  Skin: no rashes, no areas of induration  Neuro: normal tone, normal sensation to touch Psych:  normal insight, alert and oriented MSK:  Left shoulder:  Normal active flexion and abduction  Normal ER  Normal IR and ER strength to resistance  Pain with Empty can testing  Pain with speed's test  Neurovascularly intact    Limited ultrasound: left shoulder:  BT with effusion encircling the tendon. No increased vascularity  Normal appearing subscap  Supraspinatus with tendinopathy type changes  Normal infraspinatus  No effusion in posterior GH  Normal appearing AC joint   Summary: findings suggest intra-articular problem with progression of effusion down BT   Ultrasound and interpretation by Clare Gandy, MD   Aspiration/Injection Procedure Note Wendy Yates [birth year]  Procedure: Injection Indications: left shoulder pain   Procedure Details Consent: Risks of procedure as well as the alternatives and risks of each were explained to the (patient/caregiver).  Consent for procedure obtained. Time Out: Verified patient identification, verified procedure, site/side was marked, verified correct patient position, special equipment/implants available, medications/allergies/relevent history reviewed, required imaging and test results available.  Performed.  The area was cleaned with iodine and alcohol swabs.    The left posterior GH joint was injected using 1 cc's of 40 mg/mL kenalog and 4 cc's of 0.5% marcaine with a 22 3 1/2" needle.  Ultrasound was used. Images were obtained in Transverse and Long views showing the injection.   A sterile dressing was applied.  Patient did tolerate procedure well.       ASSESSMENT  & PLAN:   Tear of left glenoid labrum Had a fall a couple of months ago and has had ongoing pain.  Ultrasound was revealing for an effusion that encircles the biceps tendon that is likely result of an intra-articular process.  Likely she has a labral tear from that fall.  Does not appear to have a fracture.  Supraspinatus does have changes consistent with tendinopathy.  No bursal enlargement -Intra-articular injection today -Provided samples of  Pennsaid -Counseled on home exercise therapy -Advised to follow-up in 2 weeks.  Could consider imaging at that time.  Could consider subacromial injection at that time.

## 2017-12-27 NOTE — Patient Instructions (Signed)
Nice to you meet you  Please try to ice the shoulder  Please use tylenol for your pain  Please try the exercises if your pain is less than 2/10  Please follow up with me before you head off to the wedding.

## 2017-12-27 NOTE — Assessment & Plan Note (Signed)
Had a fall a couple of months ago and has had ongoing pain.  Ultrasound was revealing for an effusion that encircles the biceps tendon that is likely result of an intra-articular process.  Likely she has a labral tear from that fall.  Does not appear to have a fracture.  Supraspinatus does have changes consistent with tendinopathy.  No bursal enlargement -Intra-articular injection today -Provided samples of Pennsaid -Counseled on home exercise therapy -Advised to follow-up in 2 weeks.  Could consider imaging at that time.  Could consider subacromial injection at that time.

## 2017-12-31 ENCOUNTER — Encounter: Payer: Self-pay | Admitting: Family Medicine

## 2018-01-18 ENCOUNTER — Other Ambulatory Visit: Payer: Self-pay | Admitting: Internal Medicine

## 2018-01-22 ENCOUNTER — Other Ambulatory Visit: Payer: Self-pay | Admitting: Internal Medicine

## 2018-01-22 DIAGNOSIS — I10 Essential (primary) hypertension: Secondary | ICD-10-CM

## 2018-01-22 MED ORDER — HYDROCHLOROTHIAZIDE 25 MG PO TABS: 25.0000 mg | ORAL_TABLET | Freq: Every day | ORAL | 0 refills | Status: DC

## 2018-03-12 ENCOUNTER — Other Ambulatory Visit: Payer: Self-pay | Admitting: Internal Medicine

## 2018-04-07 ENCOUNTER — Other Ambulatory Visit: Payer: Self-pay | Admitting: Internal Medicine

## 2018-04-16 ENCOUNTER — Encounter: Payer: Self-pay | Admitting: Internal Medicine

## 2018-05-02 DIAGNOSIS — M25512 Pain in left shoulder: Secondary | ICD-10-CM | POA: Diagnosis not present

## 2018-05-02 DIAGNOSIS — M7061 Trochanteric bursitis, right hip: Secondary | ICD-10-CM | POA: Diagnosis not present

## 2018-05-02 DIAGNOSIS — M7062 Trochanteric bursitis, left hip: Secondary | ICD-10-CM | POA: Diagnosis not present

## 2018-05-04 DIAGNOSIS — M25512 Pain in left shoulder: Secondary | ICD-10-CM | POA: Diagnosis not present

## 2018-05-14 DIAGNOSIS — M7062 Trochanteric bursitis, left hip: Secondary | ICD-10-CM | POA: Diagnosis not present

## 2018-05-14 DIAGNOSIS — M7061 Trochanteric bursitis, right hip: Secondary | ICD-10-CM | POA: Diagnosis not present

## 2018-05-16 MED ORDER — ALBUTEROL SULFATE HFA 108 (90 BASE) MCG/ACT IN AERS: 2.0000 | INHALATION_SPRAY | Freq: Four times a day (QID) | RESPIRATORY_TRACT | 0 refills | Status: DC | PRN

## 2018-05-26 ENCOUNTER — Other Ambulatory Visit: Payer: Self-pay | Admitting: Internal Medicine

## 2018-05-30 ENCOUNTER — Encounter: Payer: Medicare Other | Admitting: Internal Medicine

## 2018-06-04 ENCOUNTER — Other Ambulatory Visit: Payer: Self-pay

## 2018-06-04 NOTE — Patient Outreach (Signed)
[facility] Customer service manager [facility]) Care Management  06/04/2018  [name] 10-Nov-1945 [id number]   Medication Adherence call to Mrs. Wendy Yates left a message for patient to call back patient is due on Atorvastatin 10 mg. [name] is showing past due under [facility].   Lillia Abed CPhT Pharmacy Technician [facility] Care Management Direct Dial [phone]  Fax [phone] [email or web address]

## 2018-06-06 ENCOUNTER — Ambulatory Visit (INDEPENDENT_AMBULATORY_CARE_PROVIDER_SITE_OTHER): Payer: Medicare Other | Admitting: Internal Medicine

## 2018-06-06 ENCOUNTER — Encounter: Payer: Self-pay | Admitting: Internal Medicine

## 2018-06-06 VITALS — BP 138/88 | HR 102 | Temp 98.0°F | Ht 60.0 in | Wt 181.0 lb

## 2018-06-06 DIAGNOSIS — J4521 Mild intermittent asthma with (acute) exacerbation: Secondary | ICD-10-CM | POA: Diagnosis not present

## 2018-06-06 MED ORDER — HYDROCODONE-HOMATROPINE 5-1.5 MG/5ML PO SYRP: 5.0000 mL | ORAL_SOLUTION | Freq: Three times a day (TID) | ORAL | 0 refills | Status: DC | PRN

## 2018-06-06 MED ORDER — ALBUTEROL SULFATE (2.5 MG/3ML) 0.083% IN NEBU: 2.5000 mg | INHALATION_SOLUTION | Freq: Four times a day (QID) | RESPIRATORY_TRACT | 3 refills | Status: DC | PRN

## 2018-06-06 MED ORDER — DOXYCYCLINE HYCLATE 100 MG PO TABS: 100.0000 mg | ORAL_TABLET | Freq: Two times a day (BID) | ORAL | 0 refills | Status: DC

## 2018-06-06 MED ORDER — PREDNISONE 20 MG PO TABS
40.0000 mg | ORAL_TABLET | Freq: Every day | ORAL | 0 refills | Status: DC
Start: 2018-06-06 — End: 2018-07-03

## 2018-06-06 NOTE — Assessment & Plan Note (Addendum)
With moderate exacerbation today, rx for prednisone, doxycycline, and hycodan (morphine allergy as she does not like taking due to risk of addiction with opiates but tolerates fine). Refilled albuterol nebulizer solution as she is running out. Continue singulair, albuterol, symbicort. [place] narcotic database reviewed and no fills in last 3 years.

## 2018-06-06 NOTE — Patient Instructions (Signed)
We have sent in cough medicine to take up to 3 times per day for cough.   We have sent in the antibiotic doxycycline 1 pill twice a day for 1 week.   We have sent in prednisone to take 2 pills daily for 5 days.

## 2018-06-06 NOTE — Progress Notes (Signed)
   Subjective:    Patient ID: Wendy Yates, female    DOB: Mar 20, 1946, 72 y.o.   MRN: [id number]  HPI The patient is a 72 YO female coming in for SOB, cough and sinus symptoms for about 9 days now. She does have concurrent asthma and is taking symbicort twice a day. She is using albuterol nebulizers which she rarely does at least 2-3 times per day. These help short term only and then she is back to having symptoms. Has not slept in 1-2 days due to SOB and coughing. She is very tired and having body aches. She is having nose drainage, ear pressure, drainage. Coughing up yellow sputum and yellow nose drainage. Using singulair and sinus rinses with some success. Overall worsening. Tried many different otc products as well without relief.   Review of Systems  Constitutional: Positive for activity change, appetite change, chills and fatigue. Negative for fever and unexpected weight change.  HENT: Positive for congestion, ear pain, postnasal drip, rhinorrhea and sinus pressure. Negative for ear discharge, sinus pain, sneezing, sore throat, tinnitus, trouble swallowing and voice change.   Eyes: Negative.   Respiratory: Positive for cough, shortness of breath and wheezing. Negative for chest tightness.   Cardiovascular: Negative.   Gastrointestinal: Negative.   Musculoskeletal: Positive for myalgias.  Skin: Negative.   Neurological: Negative.   Psychiatric/Behavioral: Negative.       Objective:   Physical Exam  Constitutional: She is oriented to person, place, and time. She appears well-developed and well-nourished.  HENT:  Head: Normocephalic and atraumatic.  Oropharynx with redness and clear drainage, nose with swollen turbinates, TMs normal bilaterally  Eyes: EOM are normal.  Neck: Normal range of motion. No thyromegaly present.  Cardiovascular: Normal rate and regular rhythm.  Pulmonary/Chest: Effort normal. No respiratory distress. She has wheezes. She has no rales.  Scattered wheezing  bilaterally  Abdominal: Soft.  Musculoskeletal: She exhibits tenderness.  Lymphadenopathy:    She has no cervical adenopathy.  Neurological: She is alert and oriented to person, place, and time.  Skin: Skin is warm and dry.   Vitals:   06/06/18 1050  BP: 138/88  Pulse: (!) 102  Temp: 98 F (36.7 C)  TempSrc: Oral  SpO2: 92%  Weight: 181 lb (82.1 kg)  Height: 5' (1.524 m)      Assessment & Plan:

## 2018-06-27 ENCOUNTER — Telehealth: Payer: Self-pay | Admitting: Internal Medicine

## 2018-06-27 NOTE — Telephone Encounter (Signed)
I do apologize.  This message was sent to [place] at [place] and I was just forwarding the message to you.   Please let me know if I can help further in any way.

## 2018-06-27 NOTE — Telephone Encounter (Signed)
What is a quantaflow?

## 2018-06-27 NOTE — Telephone Encounter (Signed)
Copied from CRM [id number]. Topic: Quick Communication - See Telephone Encounter >> Jun 27, 2018  2:15 PM Jens Som A wrote: CRM for notification. See Telephone encounter for: 06/27/18. Rowland Lathe from [facility] is calling to report clinical findings. Quantaflow was taken her left lower extremity came up abnormal with a reading of 0.89.  The right normal extremity ready 1.88 this was normal. No complaints of leg pain.  [facility] Call back # [phone]

## 2018-06-28 NOTE — Telephone Encounter (Signed)
LVM for Wendy Yates to call back and let us know what a quantaflow is.

## 2018-07-03 NOTE — Progress Notes (Deleted)
Subjective:    Patient ID: Wendy Yates, female    DOB: 10/08/1945, 72 y.o.   MRN: [id number]  HPI She is here for a physical exam.     Medications and allergies reviewed with patient and updated if appropriate.  Patient Active Problem List   Diagnosis Date Noted  . Tear of left glenoid labrum 12/26/2017  . Myalgia 12/26/2017  . Routine general medical examination at a health care facility 04/20/2015  . Hyperlipidemia 11/28/2011  . Essential hypertension   . Asthma, extrinsic   . Osteopenia   . Post-polio syndrome   . Depression   . Allergic rhinitis, cause unspecified   . Scoliosis     Current Outpatient Medications on File Prior to Visit  Medication Sig Dispense Refill  . albuterol (PROAIR HFA) 108 (90 Base) MCG/ACT inhaler Inhale 2 puffs into the lungs every 6 (six) hours as needed for wheezing or shortness of breath. 3 Inhaler 0  . albuterol (PROVENTIL) (2.5 MG/3ML) 0.083% nebulizer solution Take 3 mLs (2.5 mg total) by nebulization 4 (four) times daily as needed. 75 mL 3  . atorvastatin (LIPITOR) 10 MG tablet TAKE 1 TABLET BY MOUTH  DAILY 90 tablet 3  . busPIRone (BUSPAR) 15 MG tablet Take 1 tablet (15 mg total) by mouth 2 (two) times daily. 180 tablet 2  . busPIRone (BUSPAR) 15 MG tablet TAKE 1 TABLET BY MOUTH TWO  TIMES DAILY 180 tablet 1  . Calcium Carbonate-Vitamin D (CALTRATE 600+D) 600-400 MG-UNIT per tablet Take 1 tablet by mouth 2 (two) times daily.    . Cyanocobalamin (B-12) 2500 MCG TABS Take by mouth daily.    Marland Kitchen. doxycycline (VIBRA-TABS) 100 MG tablet Take 1 tablet (100 mg total) by mouth 2 (two) times daily. 14 tablet 0  . fexofenadine (ALLEGRA) 180 MG tablet Take 1 tablet (180 mg total) by mouth daily.    . hydrochlorothiazide (HYDRODIURIL) 25 MG tablet Take 1 tablet (25 mg total) by mouth daily. TAKE 1 TABLET BY MOUTH  DAILY 90 tablet 0  . HYDROcodone-homatropine (HYCODAN) 5-1.5 MG/5ML syrup Take 5-10 mLs by mouth every 8 (eight) hours as needed for  cough. 120 mL 0  . losartan (COZAAR) 100 MG tablet TAKE 1 TABLET BY MOUTH  DAILY 90 tablet 3  . montelukast (SINGULAIR) 10 MG tablet TAKE 1 TABLET BY MOUTH AT  BEDTIME 90 tablet 3  . Multiple Vitamin (DAILY MULTIVITAMIN PO) Take by mouth daily.    . naproxen sodium (ALEVE) 220 MG tablet Take 220 mg by mouth as needed.    Marland Kitchen. omeprazole (PRILOSEC) 20 MG capsule TAKE 1 CAPSULE BY MOUTH  DAILY 90 capsule 3  . predniSONE (DELTASONE) 20 MG tablet Take 2 tablets (40 mg total) by mouth daily with breakfast. 10 tablet 0  . sertraline (ZOLOFT) 50 MG tablet TAKE 1 TABLET BY MOUTH  DAILY 90 tablet 0  . SYMBICORT 80-4.5 MCG/ACT inhaler INL 2 PFS PO BID  0   No current facility-administered medications on file prior to visit.     Past Medical History:  Diagnosis Date  . Allergic rhinitis, cause unspecified   . Anxiety   . Asthma, extrinsic   . Depression   . Hyperlipidemia   . Hypertension   . Osteopenia    DEXA 02/2012: -2.0; 12/19/07: -2.1; 08/17/04: -1.6  . Post-polio syndrome    polio age 89,  RLE most affected  . Scoliosis    1979 harrington rod surgery  . Urine incontinence  Past Surgical History:  Procedure Laterality Date  . BACK SURGERY  1979   harrington rod inserted  . BREAST BIOPSY    . CHOLECYSTECTOMY  1969  . Left hammertoe repair    . Multiple surgeries to allow her to walk post polio    . TONSILLECTOMY  1952  . TUBAL LIGATION      Social History   Socioeconomic History  . Marital status: Single    Spouse name: Not on file  . Number of children: 2  . Years of education: 67  . Highest education level: Not on file  Occupational History  . Not on file  Social Needs  . Financial resource strain: Not on file  . Food insecurity:    Worry: Not on file    Inability: Not on file  . Transportation needs:    Medical: Not on file    Non-medical: Not on file  Tobacco Use  . Smoking status: Former Smoker    Packs/day: 0.50    Years: 10.00    Pack years: 5.00     Types: Cigarettes    Last attempt to quit: 08/21/2009    Years since quitting: 8.8  . Smokeless tobacco: Never Used  Substance and Sexual Activity  . Alcohol use: Yes    Comment: maybe once a month per pt.  . Drug use: No  . Sexual activity: Not on file  Lifestyle  . Physical activity:    Days per week: Not on file    Minutes per session: Not on file  . Stress: Not on file  Relationships  . Social connections:    Talks on phone: Not on file    Gets together: Not on file    Attends religious service: Not on file    Active member of club or organization: Not on file    Attends meetings of clubs or organizations: Not on file    Relationship status: Not on file  Other Topics Concern  . Not on file  Social History Narrative   Single, lives alone - but parents and sister near by in town   Has electric WC within home, walks with cane   Denies abuse and feels safe at home.     Family History  Problem Relation Age of Onset  . Alcohol abuse Mother   . Arthritis Mother   . Arthritis Father   . Colon cancer Neg Hx     Review of Systems     Objective:  There were no vitals filed for this visit. There were no vitals filed for this visit. There is no height or weight on file to calculate BMI.  BP Readings from Last 3 Encounters:  06/06/18 138/88  12/27/17 124/68  12/26/17 (!) 150/80    Wt Readings from Last 3 Encounters:  06/06/18 181 lb (82.1 kg)  12/27/17 181 lb (82.1 kg)  12/26/17 180 lb (81.6 kg)     Physical Exam Constitutional: She appears well-developed and well-nourished. No distress.  HENT:  Head: Normocephalic and atraumatic.  Right Ear: External ear normal. Normal ear canal and TM Left Ear: External ear normal.  Normal ear canal and TM Mouth/Throat: Oropharynx is clear and moist.  Eyes: Conjunctivae and EOM are normal.  Neck: Neck supple. No tracheal deviation present. No thyromegaly present.  No carotid bruit  Cardiovascular: Normal rate, regular rhythm  and normal heart sounds.   No murmur heard.  No edema. Pulmonary/Chest: Effort normal and breath sounds normal. No respiratory distress. She  has no wheezes. She has no rales.  Breast: deferred to Gyn Abdominal: Soft. She exhibits no distension. There is no tenderness.  Lymphadenopathy: She has no cervical adenopathy.  Skin: Skin is warm and dry. She is not diaphoretic.  Psychiatric: She has a normal mood and affect. Her behavior is normal.        Assessment & Plan:   Physical exam: Screening blood work  ordered Immunizations    Flu vaccine today, discussed shingrix Colonoscopy  Up to date  Mammogram  Up to date  Gyn Dexa  Last done 2013 - due for repeat Eye exams EKG  Done 2013 Exercise Weight Skin  Substance abuse   none  See Problem List for Assessment and Plan of chronic medical problems.

## 2018-07-04 ENCOUNTER — Other Ambulatory Visit (INDEPENDENT_AMBULATORY_CARE_PROVIDER_SITE_OTHER): Payer: Medicare Other

## 2018-07-04 ENCOUNTER — Ambulatory Visit (INDEPENDENT_AMBULATORY_CARE_PROVIDER_SITE_OTHER): Payer: Medicare Other | Admitting: *Deleted

## 2018-07-04 ENCOUNTER — Encounter: Payer: Medicare Other | Admitting: Internal Medicine

## 2018-07-04 ENCOUNTER — Telehealth: Payer: Self-pay | Admitting: Internal Medicine

## 2018-07-04 DIAGNOSIS — E785 Hyperlipidemia, unspecified: Secondary | ICD-10-CM

## 2018-07-04 DIAGNOSIS — Z0000 Encounter for general adult medical examination without abnormal findings: Secondary | ICD-10-CM | POA: Diagnosis not present

## 2018-07-04 DIAGNOSIS — Z23 Encounter for immunization: Secondary | ICD-10-CM | POA: Diagnosis not present

## 2018-07-04 LAB — LIPID PANEL
Cholesterol: 199 mg/dL (ref 0–200)
HDL: 44.5 mg/dL (ref >39.00)
LDL Cholesterol: 116 mg/dL — ABNORMAL HIGH (ref 0–99)
NONHDL: 154.36
Total CHOL/HDL Ratio: 4
Triglycerides: 194 mg/dL — ABNORMAL HIGH (ref 0.0–149.0)
VLDL: 38.8 mg/dL (ref 0.0–40.0)

## 2018-07-04 LAB — CBC
HCT: 43.9 % (ref 36.0–46.0)
HEMOGLOBIN: 14.7 g/dL (ref 12.0–15.0)
MCHC: 33.5 g/dL (ref 30.0–36.0)
MCV: 91.4 fl (ref 78.0–100.0)
PLATELETS: 305 10*3/uL (ref 150.0–400.0)
RBC: 4.8 Mil/uL (ref 3.87–5.11)
RDW: 14.7 % (ref 11.5–15.5)
WBC: 3.4 10*3/uL — ABNORMAL LOW (ref 4.0–10.5)

## 2018-07-04 LAB — COMPREHENSIVE METABOLIC PANEL
ALK PHOS: 104 U/L (ref 39–117)
ALT: 10 U/L (ref 0–35)
AST: 18 U/L (ref 0–37)
Albumin: 4.4 g/dL (ref 3.5–5.2)
BILIRUBIN TOTAL: 0.5 mg/dL (ref 0.2–1.2)
BUN: 18 mg/dL (ref 6–23)
CO2: 32 mEq/L (ref 19–32)
Calcium: 9.8 mg/dL (ref 8.4–10.5)
Chloride: 96 mEq/L (ref 96–112)
Creatinine, Ser: 0.54 mg/dL (ref 0.40–1.20)
GFR: 117.62 mL/min (ref >60.00)
GLUCOSE: 98 mg/dL (ref 70–99)
Potassium: 3.6 mEq/L (ref 3.5–5.1)
Sodium: 136 mEq/L (ref 135–145)
TOTAL PROTEIN: 7.3 g/dL (ref 6.0–8.3)

## 2018-07-04 LAB — HEMOGLOBIN A1C: HEMOGLOBIN A1C: 6.1 % (ref 4.6–6.5)

## 2018-07-04 NOTE — Telephone Encounter (Signed)
Labs placed.

## 2018-07-04 NOTE — Telephone Encounter (Signed)
Patient was scheduled for a physical with Dr Lawerance BachBurns incorrectly for this morning.I contacted the patient and rescheduled her visit with you for Tuesday, November 19th. She explained that she is handicap and has a hard time getting ready and going places. She was already ready this morning and is going to come in to get her flu shot at 8:30am and asked if she would also be able to get her labs done while she is here.  Please advise.

## 2018-07-09 ENCOUNTER — Ambulatory Visit (INDEPENDENT_AMBULATORY_CARE_PROVIDER_SITE_OTHER)
Admission: RE | Admit: 2018-07-09 | Discharge: 2018-07-09 | Disposition: A | Discharge status: Home / Self Care | Payer: Medicare Other | Source: Ambulatory Visit | Attending: Internal Medicine | Admitting: Internal Medicine

## 2018-07-09 ENCOUNTER — Ambulatory Visit (INDEPENDENT_AMBULATORY_CARE_PROVIDER_SITE_OTHER): Payer: Medicare Other | Admitting: Internal Medicine

## 2018-07-09 ENCOUNTER — Encounter: Payer: Self-pay | Admitting: Internal Medicine

## 2018-07-09 VITALS — BP 150/90 | HR 103 | Temp 98.0°F | Ht 60.0 in | Wt 183.0 lb

## 2018-07-09 DIAGNOSIS — M8580 Other specified disorders of bone density and structure, unspecified site: Secondary | ICD-10-CM

## 2018-07-09 DIAGNOSIS — G14 Postpolio syndrome: Secondary | ICD-10-CM

## 2018-07-09 DIAGNOSIS — Z0000 Encounter for general adult medical examination without abnormal findings: Secondary | ICD-10-CM

## 2018-07-09 DIAGNOSIS — J4530 Mild persistent asthma, uncomplicated: Secondary | ICD-10-CM

## 2018-07-09 DIAGNOSIS — I10 Essential (primary) hypertension: Secondary | ICD-10-CM | POA: Diagnosis not present

## 2018-07-09 DIAGNOSIS — F325 Major depressive disorder, single episode, in full remission: Secondary | ICD-10-CM | POA: Diagnosis not present

## 2018-07-09 DIAGNOSIS — E782 Mixed hyperlipidemia: Secondary | ICD-10-CM

## 2018-07-09 NOTE — Patient Instructions (Addendum)
We will get the bone density test done.  Health Maintenance, Female Adopting a healthy lifestyle and getting preventive care can go a long way to promote health and wellness. Talk with your health care provider about what schedule of regular examinations is right for you. This is a good chance for you to check in with your provider about disease prevention and staying healthy. In between checkups, there are plenty of things you can do on your own. Experts have done a lot of research about which lifestyle changes and preventive measures are most likely to keep you healthy. Ask your health care provider for more information. Weight and diet Eat a healthy diet  Be sure to include plenty of vegetables, fruits, low-fat dairy products, and lean protein.  Do not eat a lot of foods high in solid fats, added sugars, or salt.  Get regular exercise. This is one of the most important things you can do for your health. ? Most adults should exercise for at least 150 minutes each week. The exercise should increase your heart rate and make you sweat (moderate-intensity exercise). ? Most adults should also do strengthening exercises at least twice a week. This is in addition to the moderate-intensity exercise.  Maintain a healthy weight  Body mass index (BMI) is a measurement that can be used to identify possible weight problems. It estimates body fat based on height and weight. Your health care provider can help determine your BMI and help you achieve or maintain a healthy weight.  For females 37 years of age and older: ? A BMI below 18.5 is considered underweight. ? A BMI of 18.5 to 24.9 is normal. ? A BMI of 25 to 29.9 is considered overweight. ? A BMI of 30 and above is considered obese.  Watch levels of cholesterol and blood lipids  You should start having your blood tested for lipids and cholesterol at 73 years of age, then have this test every 5 years.  You may need to have your cholesterol  levels checked more often if: ? Your lipid or cholesterol levels are high. ? You are older than 73 years of age. ? You are at high risk for heart disease.  Cancer screening Lung Cancer  Lung cancer screening is recommended for adults 38-29 years old who are at high risk for lung cancer because of a history of smoking.  A yearly low-dose CT scan of the lungs is recommended for people who: ? Currently smoke. ? Have quit within the past 15 years. ? Have at least a 30-pack-year history of smoking. A pack year is smoking an average of one pack of cigarettes a day for 1 year.  Yearly screening should continue until it has been 15 years since you quit.  Yearly screening should stop if you develop a health problem that would prevent you from having lung cancer treatment.  Breast Cancer  Practice breast self-awareness. This means understanding how your breasts normally appear and feel.  It also means doing regular breast self-exams. Let your health care provider know about any changes, no matter how small.  If you are in your 20s or 30s, you should have a clinical breast exam (CBE) by a health care provider every 1-3 years as part of a regular health exam.  If you are 63 or older, have a CBE every year. Also consider having a breast X-ray (mammogram) every year.  If you have a family history of breast cancer, talk to your health care provider about  genetic screening.  If you are at high risk for breast cancer, talk to your health care provider about having an MRI and a mammogram every year.  Breast cancer gene (BRCA) assessment is recommended for women who have family members with BRCA-related cancers. BRCA-related cancers include: ? Breast. ? Ovarian. ? Tubal. ? Peritoneal cancers.  Results of the assessment will determine the need for genetic counseling and BRCA1 and BRCA2 testing.  Cervical Cancer Your health care provider may recommend that you be screened regularly for cancer of  the pelvic organs (ovaries, uterus, and vagina). This screening involves a pelvic examination, including checking for microscopic changes to the surface of your cervix (Pap test). You may be encouraged to have this screening done every 3 years, beginning at age 98.  For women ages 40-65, health care providers may recommend pelvic exams and Pap testing every 3 years, or they may recommend the Pap and pelvic exam, combined with testing for human papilloma virus (HPV), every 5 years. Some types of HPV increase your risk of cervical cancer. Testing for HPV may also be done on women of any age with unclear Pap test results.  Other health care providers may not recommend any screening for nonpregnant women who are considered low risk for pelvic cancer and who do not have symptoms. Ask your health care provider if a screening pelvic exam is right for you.  If you have had past treatment for cervical cancer or a condition that could lead to cancer, you need Pap tests and screening for cancer for at least 20 years after your treatment. If Pap tests have been discontinued, your risk factors (such as having a new sexual partner) need to be reassessed to determine if screening should resume. Some women have medical problems that increase the chance of getting cervical cancer. In these cases, your health care provider may recommend more frequent screening and Pap tests.  Colorectal Cancer  This type of cancer can be detected and often prevented.  Routine colorectal cancer screening usually begins at 73 years of age and continues through 73 years of age.  Your health care provider may recommend screening at an earlier age if you have risk factors for colon cancer.  Your health care provider may also recommend using home test kits to check for hidden blood in the stool.  A small camera at the end of a tube can be used to examine your colon directly (sigmoidoscopy or colonoscopy). This is done to check for the  earliest forms of colorectal cancer.  Routine screening usually begins at age 28.  Direct examination of the colon should be repeated every 5-10 years through 73 years of age. However, you may need to be screened more often if early forms of precancerous polyps or small growths are found.  Skin Cancer  Check your skin from head to toe regularly.  Tell your health care provider about any new moles or changes in moles, especially if there is a change in a mole's shape or color.  Also tell your health care provider if you have a mole that is larger than the size of a pencil eraser.  Always use sunscreen. Apply sunscreen liberally and repeatedly throughout the day.  Protect yourself by wearing long sleeves, pants, a wide-brimmed hat, and sunglasses whenever you are outside.  Heart disease, diabetes, and high blood pressure  High blood pressure causes heart disease and increases the risk of stroke. High blood pressure is more likely to develop in: ? People  who have blood pressure in the high end of the normal range (130-139/85-89 mm Hg). ? People who are overweight or obese. ? People who are African American.  If you are 5-22 years of age, have your blood pressure checked every 3-5 years. If you are 69 years of age or older, have your blood pressure checked every year. You should have your blood pressure measured twice-once when you are at a hospital or clinic, and once when you are not at a hospital or clinic. Record the average of the two measurements. To check your blood pressure when you are not at a hospital or clinic, you can use: ? An automated blood pressure machine at a pharmacy. ? A home blood pressure monitor.  If you are between 29 years and 27 years old, ask your health care provider if you should take aspirin to prevent strokes.  Have regular diabetes screenings. This involves taking a blood sample to check your fasting blood sugar level. ? If you are at a normal weight and  have a low risk for diabetes, have this test once every three years after 73 years of age. ? If you are overweight and have a high risk for diabetes, consider being tested at a younger age or more often. Preventing infection Hepatitis B  If you have a higher risk for hepatitis B, you should be screened for this virus. You are considered at high risk for hepatitis B if: ? You were born in a country where hepatitis B is common. Ask your health care provider which countries are considered high risk. ? Your parents were born in a high-risk country, and you have not been immunized against hepatitis B (hepatitis B vaccine). ? You have HIV or AIDS. ? You use needles to inject street drugs. ? You live with someone who has hepatitis B. ? You have had sex with someone who has hepatitis B. ? You get hemodialysis treatment. ? You take certain medicines for conditions, including cancer, organ transplantation, and autoimmune conditions.  Hepatitis C  Blood testing is recommended for: ? Everyone born from [birth year] through 1966. ? Anyone with known risk factors for hepatitis C.  Sexually transmitted infections (STIs)  You should be screened for sexually transmitted infections (STIs) including gonorrhea and chlamydia if: ? You are sexually active and are younger than 73 years of age. ? You are older than 73 years of age and your health care provider tells you that you are at risk for this type of infection. ? Your sexual activity has changed since you were last screened and you are at an increased risk for chlamydia or gonorrhea. Ask your health care provider if you are at risk.  If you do not have HIV, but are at risk, it may be recommended that you take a prescription medicine daily to prevent HIV infection. This is called pre-exposure prophylaxis (PrEP). You are considered at risk if: ? You are sexually active and do not regularly use condoms or know the HIV status of your partner(s). ? You take drugs by  injection. ? You are sexually active with a partner who has HIV.  Talk with your health care provider about whether you are at high risk of being infected with HIV. If you choose to begin PrEP, you should first be tested for HIV. You should then be tested every 3 months for as long as you are taking PrEP. Pregnancy  If you are premenopausal and you may become pregnant, ask your health care  provider about preconception counseling.  If you may become pregnant, take 400 to 800 micrograms (mcg) of folic acid every day.  If you want to prevent pregnancy, talk to your health care provider about birth control (contraception). Osteoporosis and menopause  Osteoporosis is a disease in which the bones lose minerals and strength with aging. This can result in serious bone fractures. Your risk for osteoporosis can be identified using a bone density scan.  If you are 92 years of age or older, or if you are at risk for osteoporosis and fractures, ask your health care provider if you should be screened.  Ask your health care provider whether you should take a calcium or vitamin D supplement to lower your risk for osteoporosis.  Menopause may have certain physical symptoms and risks.  Hormone replacement therapy may reduce some of these symptoms and risks. Talk to your health care provider about whether hormone replacement therapy is right for you. Follow these instructions at home:  Schedule regular health, dental, and eye exams.  Stay current with your immunizations.  Do not use any tobacco products including cigarettes, chewing tobacco, or electronic cigarettes.  If you are pregnant, do not drink alcohol.  If you are breastfeeding, limit how much and how often you drink alcohol.  Limit alcohol intake to no more than 1 drink per day for nonpregnant women. One drink equals 12 ounces of beer, 5 ounces of wine, or 1 ounces of hard liquor.  Do not use street drugs.  Do not share needles.  Ask  your health care provider for help if you need support or information about quitting drugs.  Tell your health care provider if you often feel depressed.  Tell your health care provider if you have ever been abused or do not feel safe at home. This information is not intended to replace advice given to you by your health care provider. Make sure you discuss any questions you have with your health care provider. Document Released: 02/20/2011 Document Revised: 01/13/2016 Document Reviewed: 05/11/2015  Elsevier Interactive Patient Education  [employer].

## 2018-07-09 NOTE — Assessment & Plan Note (Signed)
Stopped taking statin and will resume lipitor 10 mg 3 times per week.

## 2018-07-09 NOTE — Assessment & Plan Note (Signed)
Needs repeat DEXA which is ordered today.

## 2018-07-09 NOTE — Assessment & Plan Note (Signed)
The patient has moderate depression which is not recurrent. There are not psychotic features. Prior therapy tried unknown. Current therapy zoloft and buspar which is not adjusted today. Return in 1 year.

## 2018-07-09 NOTE — Assessment & Plan Note (Signed)
Stable with symbicort and albuterol and singulair. Flare is resolved since last visit.

## 2018-07-09 NOTE — Assessment & Plan Note (Signed)
Flu shot up to date. Pneumonia complete. Shingrix counseled. Tetanus up to date. Colonoscopy up to date. Mammogram up to date, pap smear aged out and dexa ordered. Counseled about sun safety and mole surveillance. Counseled about the dangers of distracted driving. Given 10 year screening recommendations.  

## 2018-07-09 NOTE — Progress Notes (Signed)
   Subjective:    Patient ID: Wendy Yates, female    DOB: 04-Jun-1946, 72 y.o.   MRN: [id number]  HPI Here for medicare wellness and physical, no new complaints. Please see A/P for status and treatment of chronic medical problems.   Diet: heart healthy  Physical activity: sedentary Depression/mood screen: negative Hearing: intact to whispered voice Visual acuity: grossly normal, performs annual eye exam  ADLs: capable Fall risk: none Home safety: good Cognitive evaluation: intact to orientation, naming, recall and repetition EOL planning: adv directives discussed  I have personally reviewed and have noted 1. The patient's medical and social history - reviewed today no changes 2. Their use of alcohol, tobacco or illicit drugs 3. Their current medications and supplements 4. The patient's functional ability including ADL's, fall risks, home safety risks and hearing or visual impairment. 5. Diet and physical activities 6. Evidence for depression or mood disorders 7. Care team reviewed and updated (available in snapshot)  Review of Systems  Constitutional: Negative.   HENT: Negative.   Eyes: Negative.   Respiratory: Negative for cough, chest tightness and shortness of breath.   Cardiovascular: Negative for chest pain, palpitations and leg swelling.  Gastrointestinal: Negative for abdominal distention, abdominal pain, constipation, diarrhea, nausea and vomiting.  Musculoskeletal: Positive for arthralgias and gait problem.  Skin: Negative.   Neurological: Negative for dizziness, tremors, speech difficulty, weakness and headaches.  Psychiatric/Behavioral: Negative.       Objective:   Physical Exam  Constitutional: She is oriented to person, place, and time. She appears well-developed and well-nourished.  HENT:  Head: Normocephalic and atraumatic.  Eyes: EOM are normal.  Neck: Normal range of motion.  Cardiovascular: Normal rate and regular rhythm.  Pulmonary/Chest: Effort  normal and breath sounds normal. No respiratory distress. She has no wheezes. She has no rales.  Abdominal: Soft. Bowel sounds are normal. She exhibits no distension. There is no tenderness. There is no rebound.  Musculoskeletal: She exhibits no edema.  Neurological: She is alert and oriented to person, place, and time. Coordination abnormal.  Walker with seat for ambulation, steady gait  Skin: Skin is warm and dry.  Psychiatric: She has a normal mood and affect.   Vitals:   07/09/18 1328  BP: (!) 150/90  Pulse: (!) 103  Temp: 98 F (36.7 C)  TempSrc: Oral  SpO2: 93%  Weight: 183 lb (83 kg)  Height: 5' (1.524 m)      Assessment & Plan:

## 2018-07-09 NOTE — Assessment & Plan Note (Signed)
Stable symptoms currently.

## 2018-07-09 NOTE — Assessment & Plan Note (Signed)
BP at goal on losartan and hctz. Recent CMP reviewed with patient and no indications for change.

## 2018-07-14 ENCOUNTER — Encounter: Payer: Self-pay | Admitting: Internal Medicine

## 2018-07-15 ENCOUNTER — Other Ambulatory Visit: Payer: Self-pay

## 2018-07-15 MED ORDER — ATORVASTATIN CALCIUM 10 MG PO TABS: 10.0000 mg | ORAL_TABLET | Freq: Every day | ORAL | 3 refills | Status: DC

## 2018-09-14 ENCOUNTER — Other Ambulatory Visit: Payer: Self-pay | Admitting: Internal Medicine

## 2018-10-07 ENCOUNTER — Other Ambulatory Visit: Payer: Self-pay | Admitting: Internal Medicine

## 2019-01-13 ENCOUNTER — Other Ambulatory Visit: Payer: Self-pay | Admitting: Internal Medicine

## 2019-02-13 ENCOUNTER — Other Ambulatory Visit: Payer: Self-pay

## 2019-02-13 NOTE — Patient Outreach (Signed)
[facility]) Care Management  02/13/2019  TONIQUA MELAMED Jun 21, 1946 [id number]   Medication Adherence call to Mrs. Amador Cunas HIPPA Compliant Voice message left with a call back number. Mrs. Mccutcheon is showing past due on Atorvastatin 10 mg under [facility].   [facility] Management Direct Dial [phone]  Fax [phone] [email or web address]

## 2019-02-17 ENCOUNTER — Other Ambulatory Visit: Payer: Self-pay | Admitting: Internal Medicine

## 2019-02-26 ENCOUNTER — Other Ambulatory Visit: Payer: Self-pay

## 2019-02-26 NOTE — Patient Outreach (Signed)
[facility]) Care Management  02/26/2019  ARTHI MCDONALD 1945-10-25 [id number]   Medication Adherence call to Mrs.Amador Cunas HIPPA Compliant Voice message left with a call back number. Mrs. Bonilla is showing past due under [facility].   [facility] Management Direct Dial [phone]  Fax [phone] [email or web address]

## 2019-04-05 ENCOUNTER — Other Ambulatory Visit: Payer: Self-pay | Admitting: Internal Medicine

## 2019-04-16 ENCOUNTER — Other Ambulatory Visit: Payer: Self-pay | Admitting: Internal Medicine

## 2019-06-12 ENCOUNTER — Encounter: Payer: Self-pay | Admitting: Internal Medicine

## 2019-06-13 MED ORDER — ALBUTEROL SULFATE (2.5 MG/3ML) 0.083% IN NEBU: 2.5000 mg | INHALATION_SOLUTION | Freq: Four times a day (QID) | RESPIRATORY_TRACT | 3 refills | Status: DC | PRN

## 2019-06-13 MED ORDER — PREDNISONE 20 MG PO TABS: 40.0000 mg | ORAL_TABLET | Freq: Every day | ORAL | 0 refills | Status: DC

## 2019-06-13 NOTE — Addendum Note (Signed)
Addended by: Pollyann Glen on: 06/13/2019 08:09 AM   Modules accepted: Orders

## 2019-07-16 ENCOUNTER — Encounter: Payer: Self-pay | Admitting: Internal Medicine

## 2019-07-16 ENCOUNTER — Other Ambulatory Visit: Payer: Self-pay

## 2019-07-16 ENCOUNTER — Ambulatory Visit (INDEPENDENT_AMBULATORY_CARE_PROVIDER_SITE_OTHER): Payer: Medicare Other | Admitting: Internal Medicine

## 2019-07-16 VITALS — BP 140/84 | HR 89 | Temp 98.0°F | Ht 60.0 in | Wt 189.0 lb

## 2019-07-16 DIAGNOSIS — Z23 Encounter for immunization: Secondary | ICD-10-CM | POA: Diagnosis not present

## 2019-07-16 DIAGNOSIS — J4531 Mild persistent asthma with (acute) exacerbation: Secondary | ICD-10-CM | POA: Diagnosis not present

## 2019-07-16 MED ORDER — PREDNISONE 20 MG PO TABS: 40.0000 mg | ORAL_TABLET | Freq: Every day | ORAL | 0 refills | Status: DC

## 2019-07-16 MED ORDER — DOXYCYCLINE HYCLATE 100 MG PO TABS: 100.0000 mg | ORAL_TABLET | Freq: Two times a day (BID) | ORAL | 0 refills | Status: DC

## 2019-07-16 NOTE — Assessment & Plan Note (Signed)
Likely with flare. Rx prednisone 1 week and doxycycline. Advised that in the future she needs to be truthful about symptoms as the health and safety of other persons at the office and staff is at stake given the severity of the pandemic. She requests labs and these cannot be done today and she is not happy but understands with explanation about the nature of PPE and the standard of the policy and we cannot make exceptions.

## 2019-07-16 NOTE — Patient Instructions (Addendum)
We have sent in the prednisone for the lungs and doxycycline to help clear them out.

## 2019-07-16 NOTE — Progress Notes (Signed)
   Subjective:   Patient ID: Wendy Yates, female    DOB: 04-24-1946, 73 y.o.   MRN: [id number]  HPI The patient is a 73 YO female coming in for acute sick visit. She had some respiratory symptoms about 1 month ago and we sent in some prednisone as she has asthma. This did help and she felt better but started getting worse again very quickly. She has been coughing since that time and having SOB. She is needing her albuterol inhaler twice a day along with other over the counter medications. She did purposely not report symptoms when asked at check in as she was worried she would not be allowed in. She wanted to do her physical. She denies fevers or chills. Denies muscle aches. Having a lot of sinus drainage and sinus pressure. She feels certain that this is related to her allergies and asthma. Requests flu shot today.  Review of Systems  Constitutional: Positive for activity change and appetite change. Negative for chills, fatigue, fever and unexpected weight change.  HENT: Positive for congestion, postnasal drip, rhinorrhea and sinus pressure. Negative for ear discharge, ear pain, sinus pain, sneezing, sore throat, tinnitus, trouble swallowing and voice change.   Eyes: Negative.   Respiratory: Positive for cough and shortness of breath. Negative for chest tightness and wheezing.   Cardiovascular: Negative.   Gastrointestinal: Negative.   Musculoskeletal: Negative.   Neurological: Negative.     Objective:  Physical Exam Constitutional:      Appearance: She is well-developed.  HENT:     Head: Normocephalic and atraumatic.     Comments: Oropharynx not examined, nose not examined Neck:     Musculoskeletal: Normal range of motion.     Thyroid: No thyromegaly.  Cardiovascular:     Rate and Rhythm: Normal rate and regular rhythm.  Pulmonary:     Effort: Pulmonary effort is normal. No respiratory distress.     Breath sounds: Wheezing present. No rales.  Abdominal:     Palpations: Abdomen  is soft.  Lymphadenopathy:     Cervical: No cervical adenopathy.  Skin:    General: Skin is warm and dry.  Neurological:     Mental Status: She is alert and oriented to person, place, and time.    Vitals:   07/16/19 0913  BP: 140/84  Pulse: 89  Temp: 98 F (36.7 C)  TempSrc: Oral  SpO2: 95%  Weight: 189 lb (85.7 kg)  Height: 5' (1.524 m)   This visit occurred during the SARS-CoV-2 public health emergency.  Safety protocols were in place, including screening questions prior to the visit, additional usage of staff PPE, and extensive cleaning of exam room while observing appropriate contact time as indicated for disinfecting solutions.   Assessment & Plan:  Flu shot given at visit

## 2019-07-24 ENCOUNTER — Other Ambulatory Visit: Payer: Self-pay | Admitting: Internal Medicine

## 2019-07-24 DIAGNOSIS — I10 Essential (primary) hypertension: Secondary | ICD-10-CM

## 2019-10-03 ENCOUNTER — Encounter: Payer: Self-pay | Admitting: Internal Medicine

## 2019-10-03 DIAGNOSIS — I10 Essential (primary) hypertension: Secondary | ICD-10-CM

## 2019-10-03 MED ORDER — HYDROCHLOROTHIAZIDE 25 MG PO TABS: 25.0000 mg | ORAL_TABLET | Freq: Every day | ORAL | 1 refills | Status: DC

## 2019-10-08 ENCOUNTER — Other Ambulatory Visit: Payer: Self-pay | Admitting: Internal Medicine

## 2019-10-17 ENCOUNTER — Ambulatory Visit: Payer: Medicare Other | Attending: Internal Medicine

## 2019-10-17 DIAGNOSIS — Z23 Encounter for immunization: Secondary | ICD-10-CM

## 2019-10-17 NOTE — Progress Notes (Signed)
   Covid-19 Vaccination Clinic  Name:  KAILEN NAME    MRN: [id number] DOB: April 24, 1946  10/17/2019  Ms. Sallas was observed post Covid-19 immunization for 15 minutes without incidence. She was provided with Vaccine Information Sheet and instruction to access the V-Safe system.   Ms. Mulkern was instructed to call 911 with any severe reactions post vaccine: Marland Kitchen Difficulty breathing  . Swelling of your face and throat  . A fast heartbeat  . A bad rash all over your body  . Dizziness and weakness    Immunizations Administered    Name Date Dose VIS Date Route   Pfizer COVID-19 Vaccine 10/17/2019  9:26 AM 0.3 mL 08/01/2019 Intramuscular   Manufacturer: [employer], [employer]   Lot: [id number]   NDC: [address]-5

## 2019-11-09 ENCOUNTER — Other Ambulatory Visit: Payer: Self-pay | Admitting: Internal Medicine

## 2019-11-11 ENCOUNTER — Ambulatory Visit: Payer: Medicare Other | Attending: Internal Medicine

## 2019-11-11 DIAGNOSIS — Z23 Encounter for immunization: Secondary | ICD-10-CM

## 2019-11-11 NOTE — Progress Notes (Signed)
   Covid-19 Vaccination Clinic  Name:  Wendy Yates    MRN: [id number] DOB: 1946-04-07  11/11/2019  Ms. [name] was observed post Covid-19 immunization for 15 minutes without incident. She was provided with Vaccine Information Sheet and instruction to access the V-Safe system.   [name] was instructed to call 911 with any severe reactions post vaccine: Marland Kitchen Difficulty breathing  . Swelling of face and throat  . A fast heartbeat  . A bad rash all over body  . Dizziness and weakness   Immunizations Administered    Name Date Dose VIS Date Route   Pfizer COVID-19 Vaccine 11/11/2019 11:09 AM 0.3 mL 08/01/2019 Intramuscular   Manufacturer: [employer], [employer]   Lot: [id number]   NDC: [address]-9

## 2020-01-29 ENCOUNTER — Other Ambulatory Visit: Payer: Self-pay | Admitting: Internal Medicine

## 2020-02-09 DIAGNOSIS — H5203 Hypermetropia, bilateral: Secondary | ICD-10-CM | POA: Diagnosis not present

## 2020-02-23 ENCOUNTER — Other Ambulatory Visit: Payer: Self-pay | Admitting: Internal Medicine

## 2020-02-23 DIAGNOSIS — I10 Essential (primary) hypertension: Secondary | ICD-10-CM

## 2020-03-13 ENCOUNTER — Other Ambulatory Visit: Payer: Self-pay | Admitting: Internal Medicine

## 2020-03-23 ENCOUNTER — Telehealth: Payer: Self-pay | Admitting: Internal Medicine

## 2020-03-23 NOTE — Telephone Encounter (Signed)
I received a parking placard via mail. Attached with a envelope to mail back.   Form has been completed &Placed in providers box to review and sign.

## 2020-03-25 NOTE — Telephone Encounter (Signed)
Form has been signed, Copy sent to scan.  Mailed to patient as requested.   

## 2020-05-03 ENCOUNTER — Telehealth: Payer: Self-pay | Admitting: Internal Medicine

## 2020-05-03 NOTE — Telephone Encounter (Signed)
Patient would like to transfer to our location here a [place].  Is it okay for [place] to schedule a TOC for this patient?  Thank you,  Okey Regal

## 2020-05-11 NOTE — Telephone Encounter (Signed)
Dr Okey Dupre is out of office until Nov, however, I am certain she will be fine with you scheduling a TOC for this patient.  Thank you & please reach out to [place] at the office if you have any questions.  [phone]

## 2020-05-17 ENCOUNTER — Telehealth: Payer: Self-pay | Admitting: Internal Medicine

## 2020-05-17 NOTE — Telephone Encounter (Signed)
LMVM to contact our office to schedule a TOC appointment.

## 2020-05-19 NOTE — Telephone Encounter (Signed)
disregard

## 2020-06-16 LAB — HEMOGLOBIN A1C: Hemoglobin A1C: 6

## 2020-06-18 LAB — HM HEPATITIS C SCREENING LAB: HM Hepatitis Screen: NEGATIVE

## 2020-07-23 ENCOUNTER — Encounter: Payer: Self-pay | Admitting: Internal Medicine

## 2020-07-23 ENCOUNTER — Ambulatory Visit (INDEPENDENT_AMBULATORY_CARE_PROVIDER_SITE_OTHER): Payer: Medicare Other | Admitting: Internal Medicine

## 2020-07-23 ENCOUNTER — Other Ambulatory Visit: Payer: Self-pay

## 2020-07-23 VITALS — BP 120/80 | HR 86 | Temp 97.4°F | Ht 60.0 in | Wt 189.0 lb

## 2020-07-23 DIAGNOSIS — E782 Mixed hyperlipidemia: Secondary | ICD-10-CM | POA: Diagnosis not present

## 2020-07-23 DIAGNOSIS — R7302 Impaired glucose tolerance (oral): Secondary | ICD-10-CM

## 2020-07-23 DIAGNOSIS — G14 Postpolio syndrome: Secondary | ICD-10-CM | POA: Diagnosis not present

## 2020-07-23 DIAGNOSIS — J4531 Mild persistent asthma with (acute) exacerbation: Secondary | ICD-10-CM | POA: Diagnosis not present

## 2020-07-23 DIAGNOSIS — Z23 Encounter for immunization: Secondary | ICD-10-CM | POA: Diagnosis not present

## 2020-07-23 DIAGNOSIS — F325 Major depressive disorder, single episode, in full remission: Secondary | ICD-10-CM

## 2020-07-23 DIAGNOSIS — I10 Essential (primary) hypertension: Secondary | ICD-10-CM

## 2020-07-23 DIAGNOSIS — K429 Umbilical hernia without obstruction or gangrene: Secondary | ICD-10-CM

## 2020-07-23 MED ORDER — ALBUTEROL SULFATE (2.5 MG/3ML) 0.083% IN NEBU: 2.5000 mg | INHALATION_SOLUTION | Freq: Four times a day (QID) | RESPIRATORY_TRACT | 3 refills | Status: DC | PRN

## 2020-07-23 NOTE — Addendum Note (Signed)
Addended by: Kern Reap B on: 07/23/2020 03:45 PM   Modules accepted: Orders

## 2020-07-23 NOTE — Progress Notes (Signed)
Established Patient Office Visit     This visit occurred during the SARS-CoV-2 public health emergency.  Safety protocols were in place, including screening questions prior to the visit, additional usage of staff PPE, and extensive cleaning of exam room while observing appropriate contact time as indicated for disinfecting solutions.    CC/Reason for Visit: Establish care, discuss chronic medical conditions, discuss elevated A1c, evaluate "lump above bellybutton"  HPI: Wendy Yates is a 74 y.o. female who is coming in today for the above mentioned reasons.  She is transferring care from Dr. Jarvis Newcomer.  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  She recently had a home health nurse visit and was diagnosed with impaired glucose tolerance with an A1c of 6.  She would also like me to assess a lump that she has above her abdomen that has been there for about a year.  It is nonpainful.  She does not have any bowel obstruction symptoms.  She already has an appointment scheduled for the end of the year for physical.  She was a smoker of 1/2 pack a day but quit 11 years ago.  She does not drink alcohol.  She has allergies to sulfa drugs.  She is due for a Pneumovax today.   Past Medical/Surgical History: Past Medical History:  Diagnosis Date  . Allergic rhinitis, cause unspecified   . Anxiety   . Asthma, extrinsic   . Depression   . Hyperlipidemia   . Hypertension   . Osteopenia    DEXA 02/2012: -2.0; 12/19/07: -2.1; 08/17/04: -1.6  . Post-polio syndrome    polio age 67,  RLE most affected  . Scoliosis    1979 harrington rod surgery  . Urine incontinence     Past Surgical History:  Procedure Laterality Date  . BACK SURGERY  1979   harrington rod inserted  . BREAST BIOPSY    . CHOLECYSTECTOMY  1969  . Left hammertoe repair    . Multiple surgeries to allow her to walk post polio    . TONSILLECTOMY  1952  .  TUBAL LIGATION      Social History:  reports that she quit smoking about 10 years ago. Her smoking use included cigarettes. She has a 5.00 pack-year smoking history. She has never used smokeless tobacco. She reports current alcohol use. She reports that she does not use drugs.  Allergies: Allergies  Allergen Reactions  . Morphine And Related Other (See Comments)    "crazy"  . Sulfa Antibiotics Swelling and Other (See Comments)    "swollen joints and extreme pain"    Family History:  Family History  Problem Relation Age of Onset  . Alcohol abuse Mother   . Arthritis Mother   . Arthritis Father   . Colon cancer Neg Hx      Current Outpatient Medications:  .  albuterol (PROVENTIL) (2.5 MG/3ML) 0.083% nebulizer solution, Take 3 mLs (2.5 mg total) by nebulization 4 (four) times daily as needed., Disp: 75 mL, Rfl: 3 .  albuterol (VENTOLIN HFA) 108 (90 Base) MCG/ACT inhaler, USE 2 INHALATIONS BY MOUTH  EVERY 6 HOURS AS NEEDED FOR WHEEZING OR SHORTNESS OF  BREATH, Disp: 34 g, Rfl: 2 .  atorvastatin (LIPITOR) 10 MG tablet, TAKE 1 TABLET BY MOUTH  DAILY, Disp: 90 tablet, Rfl: 3 .  Bioflavonoid Products (ESTER-C) TABS, Take by mouth daily., Disp: , Rfl:  .  busPIRone (BUSPAR) 15 MG tablet, TAKE  1 TABLET BY MOUTH  TWICE DAILY, Disp: 180 tablet, Rfl: 1 .  cyanocobalamin 1000 MCG tablet, Take 2,000 mcg by mouth daily., Disp: , Rfl:  .  doxycycline (VIBRA-TABS) 100 MG tablet, Take 1 tablet (100 mg total) by mouth 2 (two) times daily., Disp: 14 tablet, Rfl: 0 .  fexofenadine (ALLEGRA) 180 MG tablet, Take 1 tablet (180 mg total) by mouth daily., Disp: , Rfl:  .  hydrochlorothiazide (HYDRODIURIL) 25 MG tablet, TAKE 1 TABLET BY MOUTH  DAILY, Disp: 90 tablet, Rfl: 3 .  levocetirizine (XYZAL) 5 MG tablet, Take 5 mg by mouth as needed for allergies., Disp: , Rfl:  .  losartan (COZAAR) 100 MG tablet, TAKE 1 TABLET BY MOUTH  DAILY, Disp: 90 tablet, Rfl: 1 .  montelukast (SINGULAIR) 10 MG tablet, TAKE 1  TABLET BY MOUTH AT  BEDTIME, Disp: 90 tablet, Rfl: 1 .  naproxen sodium (ALEVE) 220 MG tablet, Take 220 mg by mouth as needed., Disp: , Rfl:  .  omeprazole (PRILOSEC) 20 MG capsule, TAKE 1 CAPSULE BY MOUTH  DAILY, Disp: 90 capsule, Rfl: 3 .  predniSONE (DELTASONE) 20 MG tablet, Take 2 tablets (40 mg total) by mouth daily with breakfast., Disp: 14 tablet, Rfl: 0 .  sertraline (ZOLOFT) 50 MG tablet, TAKE 1 TABLET BY MOUTH  DAILY, Disp: 90 tablet, Rfl: 1 .  zinc gluconate 50 MG tablet, Take 50 mg by mouth 2 (two) times daily., Disp: , Rfl:   Review of Systems:  Constitutional: Denies fever, chills, diaphoresis, appetite change and fatigue.  HEENT: Denies photophobia, eye pain, redness, hearing loss, ear pain, congestion, sore throat, rhinorrhea, sneezing, mouth sores, trouble swallowing, neck pain, neck stiffness and tinnitus.   Respiratory: Denies SOB, DOE, cough, chest tightness,  and wheezing.   Cardiovascular: Denies chest pain, palpitations and leg swelling.  Gastrointestinal: Denies nausea, vomiting, abdominal pain, diarrhea, constipation, blood in stool and abdominal distention.  Genitourinary: Denies dysuria, urgency, frequency, hematuria, flank pain and difficulty urinating.  Endocrine: Denies: hot or cold intolerance, sweats, changes in hair or nails, polyuria, polydipsia. Musculoskeletal: Denies myalgias, back pain, joint swelling, arthralgias and gait problem.  Skin: Denies pallor, rash and wound.  Neurological: Denies dizziness, seizures, syncope, weakness, light-headedness, numbness and headaches.  Hematological: Denies adenopathy. Easy bruising, personal or family bleeding history  Psychiatric/Behavioral: Denies suicidal ideation, mood changes, confusion, nervousness, sleep disturbance and agitation    Physical Exam: Vitals:   07/23/20 1458  BP: 120/80  Pulse: 86  Temp: (!) 97.4 F (36.3 C)  TempSrc: Oral  SpO2: 93%  Weight: 189 lb (85.7 kg)  Height: 5' (1.524 m)     Body mass index is 36.91 kg/m.   Constitutional: NAD, calm, comfortable Eyes: PERRL, lids and conjunctivae normal ENMT: Mucous membranes are moist. Respiratory: clear to auscultation bilaterally, no wheezing, no crackles. Normal respiratory effort. No accessory muscle use.  Cardiovascular: Regular rate and rhythm, no murmurs / rubs / gallops. No extremity edema. Abdomen: no tenderness, about a 2 inch round mass above her umbilicus that is easily reducible, no hepatosplenomegaly.  Psychiatric: Normal judgment and insight. Alert and oriented x 3. Normal mood.    Impression and Plan:  Essential hypertension -Well-controlled on losartan 100 mg, hydrochlorothiazide 25 mg.  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  She is also on Singulair and Xyzal.  Mixed hyperlipidemia -Last LDL on file was 116 in 2019, recheck when she returns for CPE.  Post-polio syndrome -Noted, she  ambulates with a walker, she has had multiple leg and back surgeries related to this.  Major depressive disorder with single episode, in full remission (HCC) -Mood is stable.  Need for vaccination against Streptococcus pneumoniae -PPSV23 administered today.  Umbilical hernia without obstruction and without gangrene -Observation for now.  IGT (impaired glucose tolerance) -Recent A1c of 6.1. -She will continue lifestyle modifications.   Patient Instructions  -Nice seeing you today!!  -See you back for your physical soon. Please come in fasting that day.  -Pneumonia vaccine today.      Chaya Jan, MD [facility]

## 2020-07-23 NOTE — Patient Instructions (Signed)
-  Nice seeing you today!!  -See you back for your physical soon. Please come in fasting that day.  -Pneumonia vaccine today.

## 2020-08-03 ENCOUNTER — Other Ambulatory Visit: Payer: Self-pay | Admitting: Internal Medicine

## 2020-08-18 ENCOUNTER — Other Ambulatory Visit: Payer: Self-pay

## 2020-08-19 ENCOUNTER — Ambulatory Visit (INDEPENDENT_AMBULATORY_CARE_PROVIDER_SITE_OTHER): Payer: Medicare Other | Admitting: Internal Medicine

## 2020-08-19 ENCOUNTER — Encounter: Payer: Self-pay | Admitting: Internal Medicine

## 2020-08-19 VITALS — BP 130/80 | HR 84 | Temp 97.9°F | Ht 60.0 in | Wt 185.9 lb

## 2020-08-19 DIAGNOSIS — I10 Essential (primary) hypertension: Secondary | ICD-10-CM | POA: Diagnosis not present

## 2020-08-19 DIAGNOSIS — R7302 Impaired glucose tolerance (oral): Secondary | ICD-10-CM | POA: Diagnosis not present

## 2020-08-19 DIAGNOSIS — Z0000 Encounter for general adult medical examination without abnormal findings: Secondary | ICD-10-CM | POA: Diagnosis not present

## 2020-08-19 DIAGNOSIS — Z13820 Encounter for screening for osteoporosis: Secondary | ICD-10-CM

## 2020-08-19 DIAGNOSIS — H9190 Unspecified hearing loss, unspecified ear: Secondary | ICD-10-CM | POA: Diagnosis not present

## 2020-08-19 DIAGNOSIS — G14 Postpolio syndrome: Secondary | ICD-10-CM

## 2020-08-19 DIAGNOSIS — E782 Mixed hyperlipidemia: Secondary | ICD-10-CM

## 2020-08-19 DIAGNOSIS — M419 Scoliosis, unspecified: Secondary | ICD-10-CM | POA: Diagnosis not present

## 2020-08-19 DIAGNOSIS — Z780 Asymptomatic menopausal state: Secondary | ICD-10-CM

## 2020-08-19 DIAGNOSIS — F325 Major depressive disorder, single episode, in full remission: Secondary | ICD-10-CM

## 2020-08-19 DIAGNOSIS — J4531 Mild persistent asthma with (acute) exacerbation: Secondary | ICD-10-CM

## 2020-08-19 LAB — COMPREHENSIVE METABOLIC PANEL
ALT: 14 U/L (ref 0–35)
AST: 29 U/L (ref 0–37)
Albumin: 4.6 g/dL (ref 3.5–5.2)
Alkaline Phosphatase: 84 U/L (ref 39–117)
BUN: 15 mg/dL (ref 6–23)
CO2: 33 mEq/L — ABNORMAL HIGH (ref 19–32)
Calcium: 9.6 mg/dL (ref 8.4–10.5)
Chloride: 95 mEq/L — ABNORMAL LOW (ref 96–112)
Creatinine, Ser: 0.55 mg/dL (ref 0.40–1.20)
GFR: 89.85 mL/min (ref >60.00)
Glucose, Bld: 94 mg/dL (ref 70–99)
Potassium: 4.5 mEq/L (ref 3.5–5.1)
Sodium: 133 mEq/L — ABNORMAL LOW (ref 135–145)
Total Bilirubin: 0.9 mg/dL (ref 0.2–1.2)
Total Protein: 6.9 g/dL (ref 6.0–8.3)

## 2020-08-19 LAB — CBC WITH DIFFERENTIAL/PLATELET
Basophils Absolute: 0.1 10*3/uL (ref 0.0–0.1)
Basophils Relative: 1.4 % (ref 0.0–3.0)
Eosinophils Absolute: 0.3 10*3/uL (ref 0.0–0.7)
Eosinophils Relative: 6.6 % — ABNORMAL HIGH (ref 0.0–5.0)
HCT: 44.5 % (ref 36.0–46.0)
Hemoglobin: 14.8 g/dL (ref 12.0–15.0)
Lymphocytes Relative: 28.6 % (ref 12.0–46.0)
Lymphs Abs: 1.4 10*3/uL (ref 0.7–4.0)
MCHC: 33.2 g/dL (ref 30.0–36.0)
MCV: 93.2 fl (ref 78.0–100.0)
Monocytes Absolute: 0.5 10*3/uL (ref 0.1–1.0)
Monocytes Relative: 11.6 % (ref 3.0–12.0)
Neutro Abs: 2.5 10*3/uL (ref 1.4–7.7)
Neutrophils Relative %: 51.8 % (ref 43.0–77.0)
Platelets: 284 10*3/uL (ref 150.0–400.0)
RBC: 4.78 Mil/uL (ref 3.87–5.11)
RDW: 13.3 % (ref 11.5–15.5)
WBC: 4.7 10*3/uL (ref 4.0–10.5)

## 2020-08-19 LAB — LIPID PANEL
Cholesterol: 126 mg/dL (ref 0–200)
HDL: 43 mg/dL (ref >39.00)
LDL Cholesterol: 57 mg/dL (ref 0–99)
NonHDL: 83.06
Total CHOL/HDL Ratio: 3
Triglycerides: 130 mg/dL (ref 0.0–149.0)
VLDL: 26 mg/dL (ref 0.0–40.0)

## 2020-08-19 LAB — VITAMIN B12: Vitamin B-12: 1247 pg/mL — ABNORMAL HIGH (ref 211–911)

## 2020-08-19 LAB — TSH: TSH: 1.55 u[IU]/mL (ref 0.35–4.50)

## 2020-08-19 LAB — HEMOGLOBIN A1C: Hgb A1c MFr Bld: 6 % (ref 4.6–6.5)

## 2020-08-19 LAB — VITAMIN D 25 HYDROXY (VIT D DEFICIENCY, FRACTURES): VITD: 62.45 ng/mL (ref 30.00–100.00)

## 2020-08-19 NOTE — Progress Notes (Signed)
Established Patient Office Visit     This visit occurred during the SARS-CoV-2 public health emergency.  Safety protocols were in place, including screening questions prior to the visit, additional usage of staff PPE, and extensive cleaning of exam room while observing appropriate contact time as indicated for disinfecting solutions.    CC/Reason for Visit: Annual preventive exam and subsequent Medicare wellness visit  HPI: Wendy Yates is a 74 y.o. female who is coming in today for the above mentioned reasons. Past Medical History is significant for: polio as a young child with resultant severe scoliosis, depression, hyperlipidemia, hypertension, asthma, as well as recently diagnosed impaired glucose tolerance.  She has done well with weight loss, has tried to cut down on carbs.  She has no acute complaints today.  She has routine eye care but no dental care.  She has noticed over time decreased hearing of her left ear.  She has had all immunizations with the exception of shingles.  She is overdue for DEXA scan.  She has decided to defer cancer screening due to age and lack of family history.   Past Medical/Surgical History: Past Medical History:  Diagnosis Date  . Allergic rhinitis, cause unspecified   . Anxiety   . Asthma, extrinsic   . Depression   . Hyperlipidemia   . Hypertension   . Osteopenia    DEXA 02/2012: -2.0; 12/19/07: -2.1; 08/17/04: -1.6  . Post-polio syndrome    polio age 42,  RLE most affected  . Scoliosis    1979 harrington rod surgery  . Urine incontinence     Past Surgical History:  Procedure Laterality Date  . BACK SURGERY  1979   harrington rod inserted  . BREAST BIOPSY    . CHOLECYSTECTOMY  1969  . Left hammertoe repair    . Multiple surgeries to allow her to walk post polio    . TONSILLECTOMY  1952  . TUBAL LIGATION      Social History:  reports that she quit smoking about 11 years ago. Her smoking use included cigarettes. She has a 5.00  pack-year smoking history. She has never used smokeless tobacco. She reports current alcohol use. She reports that she does not use drugs.  Allergies: Allergies  Allergen Reactions  . Morphine And Related Other (See Comments)    "crazy"  . Sulfa Antibiotics Swelling and Other (See Comments)    "swollen joints and extreme pain"    Family History:  Family History  Problem Relation Age of Onset  . Alcohol abuse Mother   . Arthritis Mother   . Arthritis Father   . Colon cancer Neg Hx      Current Outpatient Medications:  .  albuterol (PROVENTIL) (2.5 MG/3ML) 0.083% nebulizer solution, Take 3 mLs (2.5 mg total) by nebulization 4 (four) times daily as needed., Disp: 75 mL, Rfl: 3 .  albuterol (VENTOLIN HFA) 108 (90 Base) MCG/ACT inhaler, USE 2 INHALATIONS BY MOUTH  EVERY 6 HOURS AS NEEDED FOR WHEEZING OR SHORTNESS OF  BREATH, Disp: 34 g, Rfl: 2 .  atorvastatin (LIPITOR) 10 MG tablet, TAKE 1 TABLET BY MOUTH  DAILY, Disp: 90 tablet, Rfl: 3 .  Barberry-Oreg Grape-Goldenseal (BERBERINE COMPLEX PO), Take by mouth., Disp: , Rfl:  .  Bioflavonoid Products (ESTER-C) TABS, Take by mouth daily., Disp: , Rfl:  .  busPIRone (BUSPAR) 15 MG tablet, TAKE 1 TABLET BY MOUTH  TWICE DAILY, Disp: 180 tablet, Rfl: 1 .  Cinnamon 500 MG TABS, Take by  mouth., Disp: , Rfl:  .  cyanocobalamin 1000 MCG tablet, Take 2,000 mcg by mouth daily., Disp: , Rfl:  .  fexofenadine (ALLEGRA) 180 MG tablet, Take 1 tablet (180 mg total) by mouth daily., Disp: , Rfl:  .  hydrochlorothiazide (HYDRODIURIL) 25 MG tablet, TAKE 1 TABLET BY MOUTH  DAILY, Disp: 90 tablet, Rfl: 3 .  losartan (COZAAR) 100 MG tablet, TAKE 1 TABLET BY MOUTH  DAILY, Disp: 90 tablet, Rfl: 1 .  montelukast (SINGULAIR) 10 MG tablet, TAKE 1 TABLET BY MOUTH AT  BEDTIME, Disp: 90 tablet, Rfl: 1 .  naproxen sodium (ALEVE) 220 MG tablet, Take 220 mg by mouth as needed., Disp: , Rfl:  .  omeprazole (PRILOSEC) 20 MG capsule, TAKE 1 CAPSULE BY MOUTH  DAILY, Disp: 90  capsule, Rfl: 3 .  sertraline (ZOLOFT) 50 MG tablet, TAKE 1 TABLET BY MOUTH  DAILY, Disp: 90 tablet, Rfl: 1 .  zinc gluconate 50 MG tablet, Take 50 mg by mouth 2 (two) times daily., Disp: , Rfl:   Review of Systems:  Constitutional: Denies fever, chills, diaphoresis, appetite change and fatigue.  HEENT: Denies photophobia, eye pain, redness, hearing loss, ear pain, congestion, sore throat, rhinorrhea, sneezing, mouth sores, trouble swallowing, neck pain, neck stiffness and tinnitus.   Respiratory: Denies SOB, DOE, cough, chest tightness,  and wheezing.   Cardiovascular: Denies chest pain, palpitations and leg swelling.  Gastrointestinal: Denies nausea, vomiting, abdominal pain, diarrhea, constipation, blood in stool and abdominal distention.  Genitourinary: Denies dysuria, urgency, frequency, hematuria, flank pain and difficulty urinating.  Endocrine: Denies: hot or cold intolerance, sweats, changes in hair or nails, polyuria, polydipsia. Musculoskeletal: Denies myalgias. Skin: Denies pallor, rash and wound.  Neurological: Denies dizziness, seizures, syncope, weakness, light-headedness, numbness and headaches.  Hematological: Denies adenopathy. Easy bruising, personal or family bleeding history  Psychiatric/Behavioral: Denies suicidal ideation, mood changes, confusion, nervousness, sleep disturbance and agitation    Physical Exam: Vitals:   08/19/20 0806  BP: 130/80  Pulse: 84  Temp: 97.9 F (36.6 C)  TempSrc: Oral  SpO2: 97%  Weight: 185 lb 14.4 oz (84.3 kg)  Height: 5' (1.524 m)    Body mass index is 36.31 kg/m.   Constitutional: NAD, calm, comfortable obese, ambulates with a walker Eyes: PERRL, lids and conjunctivae normal ENMT: Mucous membranes are moist. Posterior pharynx clear of any exudate or lesions. Normal dentition. Tympanic membrane is pearly white, no erythema or bulging. Neck: normal, supple, no masses, no thyromegaly Respiratory: clear to auscultation  bilaterally, no wheezing, no crackles. Normal respiratory effort. No accessory muscle use.  Cardiovascular: Regular rate and rhythm, no murmurs / rubs / gallops. No extremity edema. 2+ pedal pulses.  Abdomen: no tenderness, no masses palpated. No hepatosplenomegaly. Bowel sounds positive.  Musculoskeletal: no clubbing / cyanosis.  She has severe scoliosis with right curvature.  Skin: no rashes, lesions, ulcers. No induration Neurologic: CN 2-12 grossly intact. Sensation intact, DTR normal. Strength 5/5 in all 4.  Psychiatric: Normal judgment and insight. Alert and oriented x 3. Normal mood.    Subsequent Medicare wellness visit   1. Risk factors, based on past  M,S,F -cardiovascular disease risk factors include age, history of hypertension, history of hyperlipidemia   2.  Physical activities: She used to be very active at the [employer], now works out occasionally to [employer]   3.  Depression/mood:  Stable, not depressed although she does have a history of depression   4.  Hearing:  Decreased hearing from left ear  5.  ADL's: Independent in all ADLs   6.  Fall risk:  Moderate to high fall risk due to her severe scoliosis   7.  Home safety: No problems identified   8.  Height weight, and visual acuity: height and weight as above, vision:   Visual Acuity Screening   Right eye Left eye Both eyes  Without correction:     With correction: '20/30 20/25 20/25 '     9.  Counseling:  Advise she update her shingles vaccine and DEXA scan   10. Lab orders based on risk factors: Laboratory update will be reviewed   11. Referral :  Audiology referral   12. Care plan:  Follow-up with me in 6 months   13. Cognitive assessment:  No cognitive impairment   14. Screening: Patient provided with a written and personalized 5-10 year screening schedule in the AVS.   yes   15. Provider List Update:   PCP only  16. Advance Directives: Full code   [place] Office Visit from  08/19/2020 in [place] at [facility]  PHQ-9 Total Score 1      Fall Risk  08/19/2020 07/23/2020 09/26/2017 08/17/2015 06/03/2013  Falls in the past year? 1 1 No Yes No  Number falls in past yr: 1 1 - 2 or more -  Injury with Fall? 1 1 - No -  Risk for fall due to : - - - - Impaired balance/gait     Impression and Plan:  Encounter for preventive health examination  -Advised routine eye and dental care. -She will get her shingles vaccine at the pharmacy but otherwise immunizations are up-to-date including Covid x3. -Screening labs today. -Healthy lifestyle discussed in detail. -DEXA scan to be updated today. -She has decided to defer all future cancer screening due to age.  Encounter for osteoporosis screening in asymptomatic postmenopausal patient  - Plan: DG Bone Density  Hearing loss, unspecified hearing loss type, unspecified laterality  - Plan: Ambulatory referral to Audiology  Mixed hyperlipidemia  - Plan: Lipid panel -Currently on atorvastatin 10 mg daily.  Scoliosis of thoracolumbar spine, unspecified scoliosis type Post-polio syndrome -Noted.  Essential hypertension -Well-controlled on current medications consistent of losartan and hydrochlorothiazide.  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  IGT (impaired glucose tolerance)  - Plan: Hemoglobin A1c -Last A1c was 6.0 in October   Patient Instructions  -Nice seeing you today!!  -Lab work today; will notify you once results are available.  -Shingles vaccine at the pharmacy.  -Audiology referral today.  -DEXA scan to be done.  -See you back in 6 months.   Preventive Care 42 Years and Older, Female Preventive care refers to lifestyle choices and visits with your health care provider that can promote  health and wellness. This includes:  A yearly physical exam. This is also called an annual well check.  Regular dental and eye exams.  Immunizations.  Screening for certain conditions.  Healthy lifestyle choices, such as diet and exercise. What can I expect for my preventive care visit? Physical exam Your health care provider will check:  Height and weight. These may be used to calculate body mass index (BMI), which is a measurement that tells if you are at a healthy weight.  Heart rate and blood pressure.  Your skin for abnormal spots. Counseling Your health care provider may  ask you questions about:  Alcohol, tobacco, and drug use.  Emotional well-being.  Home and relationship well-being.  Sexual activity.  Eating habits.  History of falls.  Memory and ability to understand (cognition).  Work and work Statistician.  Pregnancy and menstrual history. What immunizations do I need?  Influenza (flu) vaccine  This is recommended every year. Tetanus, diphtheria, and pertussis (Tdap) vaccine  You may need a Td booster every 10 years. Varicella (chickenpox) vaccine  You may need this vaccine if you have not already been vaccinated. Zoster (shingles) vaccine  You may need this after age 67. Pneumococcal conjugate (PCV13) vaccine  One dose is recommended after age 75. Pneumococcal polysaccharide (PPSV23) vaccine  One dose is recommended after age 96. Measles, mumps, and rubella (MMR) vaccine  You may need at least one dose of MMR if you were born in 1957 or later. You may also need a second dose. Meningococcal conjugate (MenACWY) vaccine  You may need this if you have certain conditions. Hepatitis A vaccine  You may need this if you have certain conditions or if you travel or work in places where you may be exposed to hepatitis A. Hepatitis B vaccine  You may need this if you have certain conditions or if you travel or work in places where you may be  exposed to hepatitis B. Haemophilus influenzae type b (Hib) vaccine  You may need this if you have certain conditions. You may receive vaccines as individual doses or as more than one vaccine together in one shot (combination vaccines). Talk with your health care provider about the risks and benefits of combination vaccines. What tests do I need? Blood tests  Lipid and cholesterol levels. These may be checked every 5 years, or more frequently depending on your overall health.  Hepatitis C test.  Hepatitis B test. Screening  Lung cancer screening. You may have this screening every year starting at age 26 if you have a 30-pack-year history of smoking and currently smoke or have quit within the past 15 years.  Colorectal cancer screening. All adults should have this screening starting at age 66 and continuing until age 47. Your health care provider may recommend screening at age 19 if you are at increased risk. You will have tests every 1-10 years, depending on your results and the type of screening test.  Diabetes screening. This is done by checking your blood sugar (glucose) after you have not eaten for a while (fasting). You may have this done every 1-3 years.  Mammogram. This may be done every 1-2 years. Talk with your health care provider about how often you should have regular mammograms.  BRCA-related cancer screening. This may be done if you have a family history of breast, ovarian, tubal, or peritoneal cancers. Other tests  Sexually transmitted disease (STD) testing.  Bone density scan. This is done to screen for osteoporosis. You may have this done starting at age 20. Follow these instructions at home: Eating and drinking  Eat a diet that includes fresh fruits and vegetables, whole grains, lean protein, and low-fat dairy products. Limit your intake of foods with high amounts of sugar, saturated fats, and salt.  Take vitamin and mineral supplements as recommended by your  health care provider.  Do not drink alcohol if your health care provider tells you not to drink.  If you drink alcohol: ? Limit how much you have to 0-1 drink a day. ? Be aware of how much alcohol is in your drink. In  the [place] one drink equals one 12 oz bottle of beer (355 mL), one 5 oz glass of wine (148 mL), or one 1 oz glass of hard liquor (44 mL). Lifestyle  Take daily care of your teeth and gums.  Stay active. Exercise for at least 30 minutes on 5 or more days each week.  Do not use any products that contain nicotine or tobacco, such as cigarettes, e-cigarettes, and chewing tobacco. If you need help quitting, ask your health care provider.  If you are sexually active, practice safe sex. Use a condom or other form of protection in order to prevent STIs (sexually transmitted infections).  Talk with your health care provider about taking a low-dose aspirin or statin. What's next?  Go to your health care provider once a year for a well check visit.  Ask your health care provider how often you should have your eyes and teeth checked.  Stay up to date on all vaccines. This information is not intended to replace advice given to you by your health care provider. Make sure you discuss any questions you have with your health care provider. Document Revised: 08/01/2018 Document Reviewed: 08/01/2018 Elsevier Patient Education  2020 [place], MD [facility]

## 2020-08-19 NOTE — Patient Instructions (Signed)
-Nice seeing you today!!  -Lab work today; will notify you once results are available.  -Shingles vaccine at the pharmacy.  -Audiology referral today.  -DEXA scan to be done.  -See you back in 6 months.   Preventive Care 75 Years and Older, Female Preventive care refers to lifestyle choices and visits with your health care provider that can promote health and wellness. This includes:  A yearly physical exam. This is also called an annual well check.  Regular dental and eye exams.  Immunizations.  Screening for certain conditions.  Healthy lifestyle choices, such as diet and exercise. What can I expect for my preventive care visit? Physical exam Your health care provider will check:  Height and weight. These may be used to calculate body mass index (BMI), which is a measurement that tells if you are at a healthy weight.  Heart rate and blood pressure.  Your skin for abnormal spots. Counseling Your health care provider may ask you questions about:  Alcohol, tobacco, and drug use.  Emotional well-being.  Home and relationship well-being.  Sexual activity.  Eating habits.  History of falls.  Memory and ability to understand (cognition).  Work and work Astronomer.  Pregnancy and menstrual history. What immunizations do I need?  Influenza (flu) vaccine  This is recommended every year. Tetanus, diphtheria, and pertussis (Tdap) vaccine  You may need a Td booster every 10 years. Varicella (chickenpox) vaccine  You may need this vaccine if you have not already been vaccinated. Zoster (shingles) vaccine  You may need this after age 84. Pneumococcal conjugate (PCV13) vaccine  One dose is recommended after age 76. Pneumococcal polysaccharide (PPSV23) vaccine  One dose is recommended after age 70. Measles, mumps, and rubella (MMR) vaccine  You may need at least one dose of MMR if you were born in 1957 or later. You may also need a second  dose. Meningococcal conjugate (MenACWY) vaccine  You may need this if you have certain conditions. Hepatitis A vaccine  You may need this if you have certain conditions or if you travel or work in places where you may be exposed to hepatitis A. Hepatitis B vaccine  You may need this if you have certain conditions or if you travel or work in places where you may be exposed to hepatitis B. Haemophilus influenzae type b (Hib) vaccine  You may need this if you have certain conditions. You may receive vaccines as individual doses or as more than one vaccine together in one shot (combination vaccines). Talk with your health care provider about the risks and benefits of combination vaccines. What tests do I need? Blood tests  Lipid and cholesterol levels. These may be checked every 5 years, or more frequently depending on your overall health.  Hepatitis C test.  Hepatitis B test. Screening  Lung cancer screening. You may have this screening every year starting at age 8 if you have a 30-pack-year history of smoking and currently smoke or have quit within the past 15 years.  Colorectal cancer screening. All adults should have this screening starting at age 31 and continuing until age 23. Your health care provider may recommend screening at age 45 if you are at increased risk. You will have tests every 1-10 years, depending on your results and the type of screening test.  Diabetes screening. This is done by checking your blood sugar (glucose) after you have not eaten for a while (fasting). You may have this done every 1-3 years.  Mammogram. This may  be done every 1-2 years. Talk with your health care provider about how often you should have regular mammograms.  BRCA-related cancer screening. This may be done if you have a family history of breast, ovarian, tubal, or peritoneal cancers. Other tests  Sexually transmitted disease (STD) testing.  Bone density scan. This is done to screen for  osteoporosis. You may have this done starting at age 65. Follow these instructions at home: Eating and drinking  Eat a diet that includes fresh fruits and vegetables, whole grains, lean protein, and low-fat dairy products. Limit your intake of foods with high amounts of sugar, saturated fats, and salt.  Take vitamin and mineral supplements as recommended by your health care provider.  Do not drink alcohol if your health care provider tells you not to drink.  If you drink alcohol: ? Limit how much you have to 0-1 drink a day. ? Be aware of how much alcohol is in your drink. In the [place] one drink equals one 12 oz bottle of beer (355 mL), one 5 oz glass of wine (148 mL), or one 1 oz glass of hard liquor (44 mL). Lifestyle  Take daily care of your teeth and gums.  Stay active. Exercise for at least 30 minutes on 5 or more days each week.  Do not use any products that contain nicotine or tobacco, such as cigarettes, e-cigarettes, and chewing tobacco. If you need help quitting, ask your health care provider.  If you are sexually active, practice safe sex. Use a condom or other form of protection in order to prevent STIs (sexually transmitted infections).  Talk with your health care provider about taking a low-dose aspirin or statin. What's next?  Go to your health care provider once a year for a well check visit.  Ask your health care provider how often you should have your eyes and teeth checked.  Stay up to date on all vaccines. This information is not intended to replace advice given to you by your health care provider. Make sure you discuss any questions you have with your health care provider. Document Revised: 08/01/2018 Document Reviewed: 08/01/2018 Elsevier Patient Education  2020 [employer] Inc[employer]  

## 2020-08-24 ENCOUNTER — Encounter: Payer: Self-pay | Admitting: Internal Medicine

## 2020-08-24 ENCOUNTER — Other Ambulatory Visit: Payer: Self-pay | Admitting: Internal Medicine

## 2020-08-24 DIAGNOSIS — I10 Essential (primary) hypertension: Secondary | ICD-10-CM

## 2020-08-24 DIAGNOSIS — E871 Hypo-osmolality and hyponatremia: Secondary | ICD-10-CM

## 2020-08-24 MED ORDER — BUSPIRONE HCL 15 MG PO TABS: 15.0000 mg | ORAL_TABLET | Freq: Two times a day (BID) | ORAL | 1 refills | Status: DC

## 2020-08-24 MED ORDER — LOSARTAN POTASSIUM 100 MG PO TABS: 100.0000 mg | ORAL_TABLET | Freq: Every day | ORAL | 1 refills | Status: DC

## 2020-08-24 MED ORDER — MONTELUKAST SODIUM 10 MG PO TABS: 10.0000 mg | ORAL_TABLET | Freq: Every day | ORAL | 1 refills | Status: DC

## 2020-08-24 MED ORDER — ATORVASTATIN CALCIUM 10 MG PO TABS: 10.0000 mg | ORAL_TABLET | Freq: Every day | ORAL | 1 refills | Status: DC

## 2020-08-24 MED ORDER — HYDROCHLOROTHIAZIDE 25 MG PO TABS: 25.0000 mg | ORAL_TABLET | Freq: Every day | ORAL | 1 refills | Status: DC

## 2020-08-24 MED ORDER — SERTRALINE HCL 50 MG PO TABS: 50.0000 mg | ORAL_TABLET | Freq: Every day | ORAL | 1 refills | Status: DC

## 2020-08-25 NOTE — Telephone Encounter (Signed)
Done. See lab result note.

## 2020-09-07 ENCOUNTER — Other Ambulatory Visit: Payer: Self-pay

## 2020-09-10 ENCOUNTER — Other Ambulatory Visit: Payer: Self-pay

## 2020-09-15 ENCOUNTER — Other Ambulatory Visit: Payer: Self-pay

## 2020-09-15 ENCOUNTER — Other Ambulatory Visit (INDEPENDENT_AMBULATORY_CARE_PROVIDER_SITE_OTHER): Payer: Medicare Other

## 2020-09-15 DIAGNOSIS — E871 Hypo-osmolality and hyponatremia: Secondary | ICD-10-CM | POA: Diagnosis not present

## 2020-09-15 LAB — BASIC METABOLIC PANEL
BUN: 15 mg/dL (ref 6–23)
CO2: 32 mEq/L (ref 19–32)
Calcium: 9.9 mg/dL (ref 8.4–10.5)
Chloride: 93 mEq/L — ABNORMAL LOW (ref 96–112)
Creatinine, Ser: 0.53 mg/dL (ref 0.40–1.20)
GFR: 90.61 mL/min (ref >60.00)
Glucose, Bld: 106 mg/dL — ABNORMAL HIGH (ref 70–99)
Potassium: 3.7 mEq/L (ref 3.5–5.1)
Sodium: 130 mEq/L — ABNORMAL LOW (ref 135–145)

## 2020-10-03 ENCOUNTER — Other Ambulatory Visit: Payer: Self-pay | Admitting: Internal Medicine

## 2020-10-05 ENCOUNTER — Encounter: Payer: Self-pay | Admitting: Internal Medicine

## 2020-10-05 MED ORDER — ALBUTEROL SULFATE HFA 108 (90 BASE) MCG/ACT IN AERS: INHALATION_SPRAY | RESPIRATORY_TRACT | 2 refills | Status: DC

## 2020-11-15 ENCOUNTER — Other Ambulatory Visit: Payer: Self-pay

## 2020-11-16 ENCOUNTER — Encounter: Payer: Self-pay | Admitting: Internal Medicine

## 2020-11-16 ENCOUNTER — Ambulatory Visit (INDEPENDENT_AMBULATORY_CARE_PROVIDER_SITE_OTHER): Payer: Medicare Other | Admitting: Internal Medicine

## 2020-11-16 VITALS — BP 124/88 | HR 75 | Temp 97.9°F | Wt 176.3 lb

## 2020-11-16 DIAGNOSIS — R7302 Impaired glucose tolerance (oral): Secondary | ICD-10-CM

## 2020-11-16 DIAGNOSIS — I10 Essential (primary) hypertension: Secondary | ICD-10-CM

## 2020-11-16 DIAGNOSIS — E782 Mixed hyperlipidemia: Secondary | ICD-10-CM | POA: Diagnosis not present

## 2020-11-16 DIAGNOSIS — E871 Hypo-osmolality and hyponatremia: Secondary | ICD-10-CM

## 2020-11-16 DIAGNOSIS — G14 Postpolio syndrome: Secondary | ICD-10-CM

## 2020-11-16 LAB — BASIC METABOLIC PANEL
BUN: 11 mg/dL (ref 6–23)
CO2: 31 mEq/L (ref 19–32)
Calcium: 9.8 mg/dL (ref 8.4–10.5)
Chloride: 96 mEq/L (ref 96–112)
Creatinine, Ser: 0.47 mg/dL (ref 0.40–1.20)
GFR: 93.16 mL/min (ref >60.00)
Glucose, Bld: 93 mg/dL (ref 70–99)
Potassium: 4.5 mEq/L (ref 3.5–5.1)
Sodium: 134 mEq/L — ABNORMAL LOW (ref 135–145)

## 2020-11-16 LAB — POCT GLYCOSYLATED HEMOGLOBIN (HGB A1C): Hemoglobin A1C: 5.7 % — AB (ref 4.0–5.6)

## 2020-11-16 NOTE — Patient Instructions (Signed)
-  Nice seeing you today!!  -Lab work today; will notify you once results are available.  -Schedule follow up in 6 months. 

## 2020-11-16 NOTE — Progress Notes (Signed)
Established Patient Office Visit     This visit occurred during the SARS-CoV-2 public health emergency.  Safety protocols were in place, including screening questions prior to the visit, additional usage of staff PPE, and extensive cleaning of exam room while observing appropriate contact time as indicated for disinfecting solutions.    CC/Reason for Visit: Follow-up chronic medical conditions  HPI: Wendy Yates is a 75 y.o. female who is coming in today for the above mentioned reasons. Past Medical History is significant for: Hyperlipidemia, hypertension, impaired glucose tolerance, depression.  At last visit she was noticed to be hyponatremic.  She was taken off hydrochlorothiazide.  She is due to have electrolytes rechecked today.  She has been monitoring her blood pressure at home and brings her log with her today, it has been well controlled despite cessation of hydrochlorothiazide with maximum blood pressure in the 130/80 range.  She has lost 10 pounds since her last visit.   Past Medical/Surgical History: Past Medical History:  Diagnosis Date  . Allergic rhinitis, cause unspecified   . Anxiety   . Asthma, extrinsic   . Depression   . Hyperlipidemia   . Hypertension   . Osteopenia    DEXA 02/2012: -2.0; 12/19/07: -2.1; 08/17/04: -1.6  . Post-polio syndrome    polio age 73,  RLE most affected  . Scoliosis    1979 harrington rod surgery  . Urine incontinence     Past Surgical History:  Procedure Laterality Date  . BACK SURGERY  1979   harrington rod inserted  . BREAST BIOPSY    . CHOLECYSTECTOMY  1969  . Left hammertoe repair    . Multiple surgeries to allow her to walk post polio    . TONSILLECTOMY  1952  . TUBAL LIGATION      Social History:  reports that she quit smoking about 11 years ago. Her smoking use included cigarettes. She has a 5.00 pack-year smoking history. She has never used smokeless tobacco. She reports current alcohol use. She reports that she  does not use drugs.  Allergies: Allergies  Allergen Reactions  . Morphine And Related Other (See Comments)    "crazy"  . Sulfa Antibiotics Swelling and Other (See Comments)    "swollen joints and extreme pain"    Family History:  Family History  Problem Relation Age of Onset  . Alcohol abuse Mother   . Arthritis Mother   . Arthritis Father   . Colon cancer Neg Hx      Current Outpatient Medications:  .  albuterol (PROVENTIL) (2.5 MG/3ML) 0.083% nebulizer solution, Take 3 mLs (2.5 mg total) by nebulization 4 (four) times daily as needed., Disp: 75 mL, Rfl: 3 .  albuterol (VENTOLIN HFA) 108 (90 Base) MCG/ACT inhaler, USE 2 INHALATIONS BY MOUTH  EVERY 6 HOURS AS NEEDED FOR WHEEZING OR SHORTNESS OF  BREATH, Disp: 34 g, Rfl: 2 .  atorvastatin (LIPITOR) 10 MG tablet, Take 1 tablet (10 mg total) by mouth daily., Disp: 90 tablet, Rfl: 1 .  Barberry-Oreg Grape-Goldenseal (BERBERINE COMPLEX PO), Take by mouth., Disp: , Rfl:  .  Bioflavonoid Products (ESTER-C) TABS, Take by mouth daily., Disp: , Rfl:  .  bismuth subsalicylate (PEPTO BISMOL) 262 MG/15ML suspension, Take 30 mLs by mouth every 6 (six) hours as needed., Disp: , Rfl:  .  busPIRone (BUSPAR) 15 MG tablet, Take 1 tablet (15 mg total) by mouth 2 (two) times daily., Disp: 180 tablet, Rfl: 1 .  Cinnamon 500 MG TABS,  Take by mouth., Disp: , Rfl:  .  cyanocobalamin 1000 MCG tablet, Take 2,000 mcg by mouth daily., Disp: , Rfl:  .  fexofenadine (ALLEGRA) 180 MG tablet, Take 1 tablet (180 mg total) by mouth daily., Disp: , Rfl:  .  Fluticasone-Umeclidin-Vilant (TRELEGY ELLIPTA) 100-62.5-25 MCG/INH AEPB, Inhale into the lungs., Disp: , Rfl:  .  losartan (COZAAR) 100 MG tablet, Take 1 tablet (100 mg total) by mouth daily., Disp: 90 tablet, Rfl: 1 .  magnesium 30 MG tablet, Take 30 mg by mouth 2 (two) times daily., Disp: , Rfl:  .  montelukast (SINGULAIR) 10 MG tablet, Take 1 tablet (10 mg total) by mouth at bedtime., Disp: 90 tablet, Rfl: 1 .   Probiotic Product (PRO-BIOTIC BLEND PO), Take by mouth., Disp: , Rfl:  .  sertraline (ZOLOFT) 50 MG tablet, Take 1 tablet (50 mg total) by mouth daily., Disp: 90 tablet, Rfl: 1 .  zinc gluconate 50 MG tablet, Take 50 mg by mouth 2 (two) times daily., Disp: , Rfl:  .  hydrochlorothiazide (HYDRODIURIL) 25 MG tablet, Take 1 tablet (25 mg total) by mouth daily. (Patient not taking: Reported on 11/16/2020), Disp: 90 tablet, Rfl: 1  Review of Systems:  Constitutional: Denies fever, chills, diaphoresis, appetite change and fatigue.  HEENT: Denies photophobia, eye pain, redness, hearing loss, ear pain, congestion, sore throat, rhinorrhea, sneezing, mouth sores, trouble swallowing, neck pain, neck stiffness and tinnitus.   Respiratory: Denies SOB, DOE, cough, chest tightness,  and wheezing.   Cardiovascular: Denies chest pain, palpitations and leg swelling.  Gastrointestinal: Denies nausea, vomiting, abdominal pain, diarrhea, constipation, blood in stool and abdominal distention.  Genitourinary: Denies dysuria, urgency, frequency, hematuria, flank pain and difficulty urinating.  Endocrine: Denies: hot or cold intolerance, sweats, changes in hair or nails, polyuria, polydipsia. Musculoskeletal: Denies myalgias, back pain, joint swelling, arthralgias and gait problem.  Skin: Denies pallor, rash and wound.  Neurological: Denies dizziness, seizures, syncope, weakness, light-headedness, numbness and headaches.  Hematological: Denies adenopathy. Easy bruising, personal or family bleeding history  Psychiatric/Behavioral: Denies suicidal ideation, mood changes, confusion, nervousness, sleep disturbance and agitation    Physical Exam: Vitals:   11/16/20 1007  BP: 124/88  Pulse: 75  Temp: 97.9 F (36.6 C)  TempSrc: Oral  SpO2: 97%  Weight: 176 lb 4.8 oz (80 kg)    Body mass index is 34.43 kg/m.   Constitutional: NAD, calm, comfortable, ambulates with a walker Eyes: PERRL, lids and conjunctivae  normal ENMT: Mucous membranes are moist.  Respiratory: clear to auscultation bilaterally, no wheezing, no crackles. Normal respiratory effort. No accessory muscle use.  Cardiovascular: Regular rate and rhythm, no murmurs / rubs / gallops. No extremity edema. 2 Psychiatric: Normal judgment and insight. Alert and oriented x 3. Normal mood.    Impression and Plan:  IGT (impaired glucose tolerance)  -Stable, A1c is 5.7 today, she has been congratulated on lifestyle changes and weight loss thus far.  Essential hypertension  -Well-controlled despite cessation of hydrochlorothiazide due to hyponatremia.  Hyponatremia  - Plan: Basic metabolic panel -She has been taken off hydrochlorothiazide.  Mixed hyperlipidemia -Last LDL was 57 in December 2021, she is on 10 mg of atorvastatin daily.  Post-polio syndrome -Noted, ambulates with a walker.   Patient Instructions  -Nice seeing you today!!  -Lab work today; will notify you once results are available.  -Schedule follow up in 6 months.     Chaya Jan, MD [facility] at [facility]

## 2020-12-23 DIAGNOSIS — J301 Allergic rhinitis due to pollen: Secondary | ICD-10-CM | POA: Diagnosis not present

## 2020-12-23 DIAGNOSIS — J4521 Mild intermittent asthma with (acute) exacerbation: Secondary | ICD-10-CM | POA: Diagnosis not present

## 2021-02-18 ENCOUNTER — Other Ambulatory Visit: Payer: Self-pay | Admitting: Internal Medicine

## 2021-05-04 ENCOUNTER — Other Ambulatory Visit: Payer: Self-pay | Admitting: Internal Medicine

## 2021-06-27 ENCOUNTER — Other Ambulatory Visit: Payer: Self-pay

## 2021-06-28 ENCOUNTER — Encounter: Payer: Self-pay | Admitting: Internal Medicine

## 2021-06-28 ENCOUNTER — Ambulatory Visit (INDEPENDENT_AMBULATORY_CARE_PROVIDER_SITE_OTHER): Payer: Medicare Other | Admitting: Internal Medicine

## 2021-06-28 VITALS — BP 124/80 | HR 86 | Temp 97.9°F | Ht 61.0 in | Wt 172.4 lb

## 2021-06-28 DIAGNOSIS — M419 Scoliosis, unspecified: Secondary | ICD-10-CM | POA: Diagnosis not present

## 2021-06-28 DIAGNOSIS — Z23 Encounter for immunization: Secondary | ICD-10-CM

## 2021-06-28 DIAGNOSIS — J4531 Mild persistent asthma with (acute) exacerbation: Secondary | ICD-10-CM | POA: Diagnosis not present

## 2021-06-28 DIAGNOSIS — I10 Essential (primary) hypertension: Secondary | ICD-10-CM | POA: Diagnosis not present

## 2021-06-28 DIAGNOSIS — G14 Postpolio syndrome: Secondary | ICD-10-CM

## 2021-06-28 DIAGNOSIS — Z0000 Encounter for general adult medical examination without abnormal findings: Secondary | ICD-10-CM

## 2021-06-28 DIAGNOSIS — M8580 Other specified disorders of bone density and structure, unspecified site: Secondary | ICD-10-CM | POA: Diagnosis not present

## 2021-06-28 DIAGNOSIS — F325 Major depressive disorder, single episode, in full remission: Secondary | ICD-10-CM | POA: Diagnosis not present

## 2021-06-28 DIAGNOSIS — E782 Mixed hyperlipidemia: Secondary | ICD-10-CM

## 2021-06-28 LAB — COMPREHENSIVE METABOLIC PANEL
ALT: 11 U/L (ref 0–35)
AST: 23 U/L (ref 0–37)
Albumin: 4.5 g/dL (ref 3.5–5.2)
Alkaline Phosphatase: 101 U/L (ref 39–117)
BUN: 11 mg/dL (ref 6–23)
CO2: 30 mEq/L (ref 19–32)
Calcium: 9.6 mg/dL (ref 8.4–10.5)
Chloride: 95 mEq/L — ABNORMAL LOW (ref 96–112)
Creatinine, Ser: 0.5 mg/dL (ref 0.40–1.20)
GFR: 91.38 mL/min (ref >60.00)
Glucose, Bld: 85 mg/dL (ref 70–99)
Potassium: 4 mEq/L (ref 3.5–5.1)
Sodium: 132 mEq/L — ABNORMAL LOW (ref 135–145)
Total Bilirubin: 0.9 mg/dL (ref 0.2–1.2)
Total Protein: 7.4 g/dL (ref 6.0–8.3)

## 2021-06-28 LAB — CBC WITH DIFFERENTIAL/PLATELET
Basophils Absolute: 0 10*3/uL (ref 0.0–0.1)
Basophils Relative: 0.7 % (ref 0.0–3.0)
Eosinophils Absolute: 0.4 10*3/uL (ref 0.0–0.7)
Eosinophils Relative: 6.7 % — ABNORMAL HIGH (ref 0.0–5.0)
HCT: 45.9 % (ref 36.0–46.0)
Hemoglobin: 15 g/dL (ref 12.0–15.0)
Lymphocytes Relative: 35.2 % (ref 12.0–46.0)
Lymphs Abs: 1.9 10*3/uL (ref 0.7–4.0)
MCHC: 32.7 g/dL (ref 30.0–36.0)
MCV: 94.1 fl (ref 78.0–100.0)
Monocytes Absolute: 0.6 10*3/uL (ref 0.1–1.0)
Monocytes Relative: 11 % (ref 3.0–12.0)
Neutro Abs: 2.4 10*3/uL (ref 1.4–7.7)
Neutrophils Relative %: 46.4 % (ref 43.0–77.0)
Platelets: 302 10*3/uL (ref 150.0–400.0)
RBC: 4.88 Mil/uL (ref 3.87–5.11)
RDW: 13.4 % (ref 11.5–15.5)
WBC: 5.3 10*3/uL (ref 4.0–10.5)

## 2021-06-28 LAB — LIPID PANEL
Cholesterol: 135 mg/dL (ref 0–200)
HDL: 47.2 mg/dL (ref >39.00)
LDL Cholesterol: 67 mg/dL (ref 0–99)
NonHDL: 87.99
Total CHOL/HDL Ratio: 3
Triglycerides: 104 mg/dL (ref 0.0–149.0)
VLDL: 20.8 mg/dL (ref 0.0–40.0)

## 2021-06-28 LAB — VITAMIN B12: Vitamin B-12: 1486 pg/mL — ABNORMAL HIGH (ref 211–911)

## 2021-06-28 LAB — HEMOGLOBIN A1C: Hgb A1c MFr Bld: 6.1 % (ref 4.6–6.5)

## 2021-06-28 LAB — VITAMIN D 25 HYDROXY (VIT D DEFICIENCY, FRACTURES): VITD: 58.05 ng/mL (ref 30.00–100.00)

## 2021-06-28 LAB — TSH: TSH: 1.43 u[IU]/mL (ref 0.35–5.50)

## 2021-06-28 MED ORDER — BUDESONIDE-FORMOTEROL FUMARATE 160-4.5 MCG/ACT IN AERO: 2.0000 | INHALATION_SPRAY | Freq: Two times a day (BID) | RESPIRATORY_TRACT | 3 refills | Status: DC

## 2021-06-28 NOTE — Progress Notes (Signed)
Established Patient Office Visit     This visit occurred during the SARS-CoV-2 public health emergency.  Safety protocols were in place, including screening questions prior to the visit, additional usage of staff PPE, and extensive cleaning of exam room while observing appropriate contact time as indicated for disinfecting solutions.    CC/Reason for Visit: Annual preventive exam and subsequent Medicare wellness visit  HPI: Wendy Yates is a 75 y.o. female who is coming in today for the above mentioned reasons. Past Medical History is significant for: Hyperlipidemia, hypertension, impaired glucose tolerance, depression, mild persistent asthma.  She would like a refill of Symbicort today.  She is overdue for routine eye and dental care.  She had a colonoscopy in 2013, she no longer would like to pursue mammograms.  DEXA scan is overdue.  She is also due for flu, COVID booster and shingles vaccines.  She has no acute concerns or complaints today.   Past Medical/Surgical History: Past Medical History:  Diagnosis Date   Allergic rhinitis, cause unspecified    Anxiety    Asthma, extrinsic    Depression    Hyperlipidemia    Hypertension    Osteopenia    DEXA 02/2012: -2.0; 12/19/07: -2.1; 08/17/04: -1.6   Post-polio syndrome    polio age 71,  RLE most affected   Scoliosis    1979 harrington rod surgery   Urine incontinence     Past Surgical History:  Procedure Laterality Date   BACK SURGERY  1979   harrington rod inserted   BREAST BIOPSY     CHOLECYSTECTOMY  1969   Left hammertoe repair     Multiple surgeries to allow her to walk post polio     TONSILLECTOMY  1952   TUBAL LIGATION      Social History:  reports that she quit smoking about 11 years ago. Her smoking use included cigarettes. She has a 5.00 pack-year smoking history. She has never used smokeless tobacco. She reports current alcohol use. She reports that she does not use drugs.  Allergies: Allergies   Allergen Reactions   Morphine And Related Other (See Comments)    "crazy"   Sulfa Antibiotics Swelling and Other (See Comments)    "swollen joints and extreme pain"    Family History:  Family History  Problem Relation Age of Onset   Alcohol abuse Mother    Arthritis Mother    Arthritis Father    Colon cancer Neg Hx      Current Outpatient Medications:    budesonide-formoterol (SYMBICORT) 160-4.5 MCG/ACT inhaler, Inhale 2 puffs into the lungs 2 (two) times daily., Disp: 1 each, Rfl: 3   albuterol (PROVENTIL) (2.5 MG/3ML) 0.083% nebulizer solution, Take 3 mLs (2.5 mg total) by nebulization 4 (four) times daily as needed., Disp: 75 mL, Rfl: 3   albuterol (VENTOLIN HFA) 108 (90 Base) MCG/ACT inhaler, USE 2 INHALATIONS BY MOUTH  EVERY 6 HOURS AS NEEDED FOR WHEEZING OR SHORTNESS OF  BREATH, Disp: 34 g, Rfl: 3   atorvastatin (LIPITOR) 10 MG tablet, TAKE 1 TABLET BY MOUTH  DAILY, Disp: 90 tablet, Rfl: 3   Barberry-Oreg Grape-Goldenseal (BERBERINE COMPLEX PO), Take by mouth., Disp: , Rfl:    Bioflavonoid Products (ESTER-C) TABS, Take by mouth daily., Disp: , Rfl:    busPIRone (BUSPAR) 15 MG tablet, TAKE 1 TABLET BY MOUTH  TWICE DAILY, Disp: 180 tablet, Rfl: 1   Cinnamon 500 MG TABS, Take by mouth., Disp: , Rfl:    cyanocobalamin  1000 MCG tablet, Take 2,000 mcg by mouth daily., Disp: , Rfl:    fexofenadine (ALLEGRA) 180 MG tablet, Take 1 tablet (180 mg total) by mouth daily., Disp: , Rfl:    losartan (COZAAR) 100 MG tablet, TAKE 1 TABLET BY MOUTH  DAILY, Disp: 90 tablet, Rfl: 3   magnesium 30 MG tablet, Take 30 mg by mouth 2 (two) times daily., Disp: , Rfl:    montelukast (SINGULAIR) 10 MG tablet, TAKE 1 TABLET BY MOUTH AT  BEDTIME, Disp: 90 tablet, Rfl: 3   sertraline (ZOLOFT) 50 MG tablet, TAKE 1 TABLET BY MOUTH  DAILY, Disp: 90 tablet, Rfl: 3   zinc gluconate 50 MG tablet, Take 50 mg by mouth 2 (two) times daily., Disp: , Rfl:   Review of Systems:  Constitutional: Denies fever, chills,  diaphoresis, appetite change and fatigue.  HEENT: Denies photophobia, eye pain, redness, hearing loss, ear pain, congestion, sore throat, rhinorrhea, sneezing, mouth sores, trouble swallowing, neck pain, neck stiffness and tinnitus.   Respiratory: Denies SOB, DOE, cough, chest tightness,  and wheezing.   Cardiovascular: Denies chest pain, palpitations and leg swelling.  Gastrointestinal: Denies nausea, vomiting, abdominal pain, diarrhea, constipation, blood in stool and abdominal distention.  Genitourinary: Denies dysuria, urgency, frequency, hematuria, flank pain and difficulty urinating.  Endocrine: Denies: hot or cold intolerance, sweats, changes in hair or nails, polyuria, polydipsia. Musculoskeletal: Denies myalgias, back pain, joint swelling, arthralgias and gait problem.  Skin: Denies pallor, rash and wound.  Neurological: Denies dizziness, seizures, syncope, weakness, light-headedness, numbness and headaches.  Hematological: Denies adenopathy. Easy bruising, personal or family bleeding history  Psychiatric/Behavioral: Denies suicidal ideation, mood changes, confusion, nervousness, sleep disturbance and agitation    Physical Exam: Vitals:   06/28/21 1432  BP: 124/80  Pulse: 86  Temp: 97.9 F (36.6 C)  TempSrc: Oral  SpO2: 94%  Weight: 172 lb 6 oz (78.2 kg)  Height: 5\' 1"  (1.549 m)    Body mass index is 32.57 kg/m.   Constitutional: NAD, calm, comfortable, she has scoliosis Eyes: PERRL, lids and conjunctivae normal ENMT: Mucous membranes are moist. Posterior pharynx clear of any exudate or lesions. Normal dentition. Tympanic membrane is pearly white, no erythema or bulging. Neck: normal, supple, no masses, no thyromegaly Respiratory: clear to auscultation bilaterally, no wheezing, no crackles. Normal respiratory effort. No accessory muscle use.  Cardiovascular: Regular rate and rhythm, no murmurs / rubs / gallops. No extremity edema. 2+ pedal pulses. No carotid bruits.   Abdomen: no tenderness, no masses palpated. No hepatosplenomegaly. Bowel sounds positive.  Musculoskeletal: no clubbing / cyanosis.  Good ROM, no contractures. Normal muscle tone.  Skin: no rashes, lesions, ulcers. No induration Neurologic: CN 2-12 grossly intact. Sensation intact, DTR normal. Strength 5/5 in all 4.  Psychiatric: Normal judgment and insight. Alert and oriented x 3. Normal mood.    Subsequent Medicare wellness visit   1. Risk factors, based on past  M,S,F -cardiovascular disease risk factors include age, history of hypertension, hyperlipidemia.   2.  Physical activities: Limited by her severe scoliosis and post polio syndrome   3.  Depression/mood: History of depression but mood is stable   4.  Hearing: Mild hearing difficulty out of the left ear   5.  ADL's: Independent in all ADLs   6.  Fall risk: Moderate fall risk   7.  Home safety: No problems identified   8.  Height weight, and visual acuity: height and weight as above, Wendy: 20/30 with the right eye, 20/20 with  the left eye and 20/20 with eyes together.    9.  Counseling: Advise she update her vaccination status   10. Lab orders based on risk factors: Laboratory update will be reviewed   11. Referral : None today   12. Care plan: Follow-up with me in 6 months   13. Cognitive assessment: No cognitive impairment   14. Screening: Patient provided with a written and personalized 5-10 year screening schedule in the AVS. yes   15. Provider List Update: PCP only  16. Advance Directives: Full code   17. Opioids: Patient is not on any opioid prescriptions and has no risk factors for a substance use disorder.   [facility] Office Visit from 06/28/2021 in [facility] at [place]  PHQ-9 Total Score 1       Fall Risk 08/17/2015 09/26/2017 07/23/2020 08/19/2020 06/28/2021  Falls in the past year? Yes No 1 1 1   Was there an injury with Fall? No - 1 1 0  Fall Risk Category Calculator - - 3 3 1    Fall Risk Category - - High High Low  Patient Fall Risk Level - - - - Moderate fall risk  Patient at Risk for Falls Due to - - - - History of fall(s)     Impression and Plan:  Encounter for preventive health examination -Recommend routine eye and dental care. -Immunizations: Flu vaccine today, she will get COVID booster and shingles vaccinations at pharmacy -Healthy lifestyle discussed in detail. -Labs to be updated today. -Colon cancer screening: August 2013 -Breast cancer screening: She elects to defer due to age -Cervical cancer screening: She elects to defer due to age -Lung cancer screening: Not applicable -Prostate cancer screening: Not applicable -DEXA: [id number], ordered today  Essential hypertension  - Plan: CBC with Differential/Platelet, Comprehensive metabolic panel, Hemoglobin A1c, Hemoglobin A1c, Comprehensive metabolic panel, CBC with Differential/Platelet -Blood pressure is well controlled today.  Mixed hyperlipidemia  - Plan: Lipid panel, Lipid panel  Major depressive disorder with single episode, in full remission ([place])  - Plan: TSH, Vitamin B12, VITAMIN D 25 Hydroxy (Vit-D Deficiency, Fractures), VITAMIN D 25 Hydroxy (Vit-D Deficiency, Fractures), Vitamin B12, TSH  Mild persistent extrinsic asthma with acute exacerbation  - Plan: budesonide-formoterol (SYMBICORT) 160-4.5 MCG/ACT inhaler  Post-polio syndrome Scoliosis of thoracolumbar spine, unspecified scoliosis type -Noted  Osteopenia, unspecified location  - Plan: DG BONE DENSITY (DXA)  Need for influenza vaccination  - Plan: Flu Vaccine QUAD High Dose(Fluad)    Patient Instructions  -Nice seeing you today!!  -Lab work today; will notify you once results are available.  -Flu vaccine today.  -Remember your COVID booster and shingles vaccines at the pharmacy.  -DEXA will be ordered.  -Schedule follow up in 6 months.     Wendy Frohlich, MD [facility] at  [facility]

## 2021-06-28 NOTE — Patient Instructions (Signed)
-  Nice seeing you today!!  -Lab work today; will notify you once results are available.  -Flu vaccine today.  -Remember your COVID booster and shingles vaccines at the pharmacy.  -DEXA will be ordered.  -Schedule follow up in 6 months.

## 2021-06-29 ENCOUNTER — Encounter: Payer: Self-pay | Admitting: Internal Medicine

## 2021-06-29 DIAGNOSIS — R7302 Impaired glucose tolerance (oral): Secondary | ICD-10-CM | POA: Insufficient documentation

## 2021-08-25 ENCOUNTER — Encounter: Payer: Self-pay | Admitting: Internal Medicine

## 2021-08-25 MED ORDER — BUSPIRONE HCL 15 MG PO TABS: 15.0000 mg | ORAL_TABLET | Freq: Two times a day (BID) | ORAL | 1 refills | Status: DC

## 2021-12-20 ENCOUNTER — Other Ambulatory Visit: Payer: Self-pay | Admitting: Family Medicine

## 2021-12-27 ENCOUNTER — Ambulatory Visit (INDEPENDENT_AMBULATORY_CARE_PROVIDER_SITE_OTHER): Payer: Medicare Other | Admitting: Internal Medicine

## 2021-12-27 ENCOUNTER — Encounter: Payer: Self-pay | Admitting: Internal Medicine

## 2021-12-27 VITALS — BP 132/81 | HR 84 | Temp 97.4°F | Wt 172.7 lb

## 2021-12-27 DIAGNOSIS — E782 Mixed hyperlipidemia: Secondary | ICD-10-CM | POA: Diagnosis not present

## 2021-12-27 DIAGNOSIS — I10 Essential (primary) hypertension: Secondary | ICD-10-CM

## 2021-12-27 DIAGNOSIS — M8580 Other specified disorders of bone density and structure, unspecified site: Secondary | ICD-10-CM | POA: Diagnosis not present

## 2021-12-27 DIAGNOSIS — R7302 Impaired glucose tolerance (oral): Secondary | ICD-10-CM

## 2021-12-27 DIAGNOSIS — F325 Major depressive disorder, single episode, in full remission: Secondary | ICD-10-CM

## 2021-12-27 DIAGNOSIS — J4531 Mild persistent asthma with (acute) exacerbation: Secondary | ICD-10-CM

## 2021-12-27 LAB — POCT GLYCOSYLATED HEMOGLOBIN (HGB A1C): Hemoglobin A1C: 5.6 % (ref 4.0–5.6)

## 2021-12-27 MED ORDER — ALBUTEROL SULFATE (2.5 MG/3ML) 0.083% IN NEBU: 2.5000 mg | INHALATION_SOLUTION | Freq: Four times a day (QID) | RESPIRATORY_TRACT | 3 refills | Status: AC | PRN

## 2021-12-27 NOTE — Progress Notes (Signed)
? ? ? ?Established Patient Office Visit ? ? ? ? ?CC/Reason for Visit: 83-month follow-up chronic medical conditions ? ?HPI: Wendy Yates is a 76 y.o. female who is coming in today for the above mentioned reasons. Past Medical History is significant for: Hypertension, hyperlipidemia, impaired glucose tolerance, depression and mild intermittent asthma.  She has been feeling well since we last spoke other than some increased family stressors that she feels are worsening her depression.  She does not currently attend CBT.  Her blood pressure is noted to be elevated today on 2 consecutive measurements, however her home blood pressure this morning was 132/81.  She did not take medications today.  She had her shingles vaccine and has scheduled her eye and dental appointments since I last saw her.  She has not yet been called to schedule her bone density. ? ? ?Past Medical/Surgical History: ?Past Medical History:  ?Diagnosis Date  ? Allergic rhinitis, cause unspecified   ? Anxiety   ? Asthma, extrinsic   ? Depression   ? Hyperlipidemia   ? Hypertension   ? Osteopenia   ? DEXA 02/2012: -2.0; 12/19/07: -2.1; 08/17/04: -1.6  ? Post-polio syndrome   ? polio age 59,  RLE most affected  ? Scoliosis   ? 1979 harrington rod surgery  ? Urine incontinence   ? ? ?Past Surgical History:  ?Procedure Laterality Date  ? BACK SURGERY  1979  ? harrington rod inserted  ? BREAST BIOPSY    ? CHOLECYSTECTOMY  1969  ? Left hammertoe repair    ? Multiple surgeries to allow her to walk post polio    ? TONSILLECTOMY  1952  ? TUBAL LIGATION    ? ? ?Social History: ? reports that she quit smoking about 12 years ago. Her smoking use included cigarettes. She has a 5.00 pack-year smoking history. She has never used smokeless tobacco. She reports current alcohol use. She reports that she does not use drugs. ? ?Allergies: ?Allergies  ?Allergen Reactions  ? Morphine And Related Other (See Comments)  ?  "crazy"  ? Sulfa Antibiotics Swelling and Other (See  Comments)  ?  "swollen joints and extreme pain"  ? ? ?Family History:  ?Family History  ?Problem Relation Age of Onset  ? Alcohol abuse Mother   ? Arthritis Mother   ? Arthritis Father   ? Colon cancer Neg Hx   ? ? ? ?Current Outpatient Medications:  ?  albuterol (PROVENTIL) (2.5 MG/3ML) 0.083% nebulizer solution, Take 3 mLs (2.5 mg total) by nebulization 4 (four) times daily as needed., Disp: 75 mL, Rfl: 3 ?  albuterol (VENTOLIN HFA) 108 (90 Base) MCG/ACT inhaler, USE 2 INHALATIONS BY MOUTH  EVERY 6 HOURS AS NEEDED FOR WHEEZING OR SHORTNESS OF  BREATH, Disp: 34 g, Rfl: 3 ?  atorvastatin (LIPITOR) 10 MG tablet, TAKE 1 TABLET BY MOUTH  DAILY, Disp: 90 tablet, Rfl: 1 ?  Barberry-Oreg Grape-Goldenseal (BERBERINE COMPLEX PO), Take by mouth., Disp: , Rfl:  ?  Bioflavonoid Products (ESTER-C) TABS, Take by mouth daily., Disp: , Rfl:  ?  budesonide-formoterol (SYMBICORT) 160-4.5 MCG/ACT inhaler, Inhale 2 puffs into the lungs 2 (two) times daily., Disp: 1 each, Rfl: 3 ?  busPIRone (BUSPAR) 15 MG tablet, Take 1 tablet (15 mg total) by mouth 2 (two) times daily., Disp: 180 tablet, Rfl: 1 ?  cyanocobalamin 1000 MCG tablet, Take 2,000 mcg by mouth daily., Disp: , Rfl:  ?  losartan (COZAAR) 100 MG tablet, TAKE 1 TABLET BY MOUTH  DAILY, Disp: 90 tablet, Rfl: 1 ?  magnesium 30 MG tablet, Take 30 mg by mouth 2 (two) times daily., Disp: , Rfl:  ?  montelukast (SINGULAIR) 10 MG tablet, TAKE 1 TABLET BY MOUTH AT  BEDTIME, Disp: 90 tablet, Rfl: 1 ?  sertraline (ZOLOFT) 50 MG tablet, TAKE 1 TABLET BY MOUTH  DAILY, Disp: 90 tablet, Rfl: 1 ? ?Review of Systems:  ?Constitutional: Denies fever, chills, diaphoresis, appetite change and fatigue.  ?HEENT: Denies photophobia, eye pain, redness, hearing loss, ear pain, congestion, sore throat, rhinorrhea, sneezing, mouth sores, trouble swallowing, neck pain, neck stiffness and tinnitus.   ?Respiratory: Denies SOB, DOE, cough, chest tightness,  and wheezing.   ?Cardiovascular: Denies chest pain,  palpitations and leg swelling.  ?Gastrointestinal: Denies nausea, vomiting, abdominal pain, diarrhea, constipation, blood in stool and abdominal distention.  ?Genitourinary: Denies dysuria, urgency, frequency, hematuria, flank pain and difficulty urinating.  ?Endocrine: Denies: hot or cold intolerance, sweats, changes in hair or nails, polyuria, polydipsia. ?Musculoskeletal: Denies myalgias, back pain, joint swelling, arthralgias and gait problem.  ?Skin: Denies pallor, rash and wound.  ?Neurological: Denies dizziness, seizures, syncope, weakness, light-headedness, numbness and headaches.  ?Hematological: Denies adenopathy. Easy bruising, personal or family bleeding history  ?Psychiatric/Behavioral: Denies suicidal ideation, mood changes, confusion, nervousness, sleep disturbance and agitation ? ? ? ?Physical Exam: ?Vitals:  ? 12/27/21 0831 12/27/21 5465 12/27/21 0858  ?BP: (!) 160/96 140/90 132/81  ?Pulse: 84    ?Temp: (!) 97.4 ?F (36.3 ?C)    ?TempSrc: Oral    ?SpO2: 91%    ?Weight: 172 lb 11.2 oz (78.3 kg)    ? ? ?Body mass index is 32.63 kg/m?. ? ? ?Constitutional: NAD, calm, comfortable, ambulates with a walker ?Eyes: PERRL, lids and conjunctivae normal ?ENMT: Mucous membranes are moist.  ?Respiratory: clear to auscultation bilaterally, no wheezing, no crackles. Normal respiratory effort. No accessory muscle use.  ?Cardiovascular: Regular rate and rhythm, no murmurs / rubs / gallops. No extremity edema.  ?Psychiatric: Normal judgment and insight. Alert and oriented x 3. Normal mood.  ? ? ?Impression and Plan: ? ?IGT (impaired glucose tolerance)  ?- Plan: POC HgB A1c ?-Stable with an A1c of 5.6 that is improved from 6.1 in November. ? ?Mild persistent extrinsic asthma with acute exacerbation ?-Stable, refill inhaler. ? ?Osteopenia, unspecified location ?-We will reschedule her DEXA. ? ?Mixed hyperlipidemia ?-She is on atorvastatin, last LDL was 67 in November 2022. ? ?Essential hypertension ?-Blood pressure  elevated in office today but normal at home, she will do ambulatory measurements and call if elevated. ? ?Major depressive disorder with single episode, in full remission (HCC) ?Flowsheet Row Office Visit from 12/27/2021 in [facility] at [place]  ?PHQ-9 Total Score 11  ? ?  ? ?-Have advised to continue current doses of sertraline and buspirone.  Have advised that she consider CBT. ? ? ? ?Time spent:34 minutes reviewing chart, interviewing and examining patient and formulating plan of care. ? ? ?Patient Instructions  ?-Nice seeing you today!! ? ?-See you back in 6 months for your physical. ? ? ? ?Chaya Jan, MD ?[facility] ? ? ?

## 2021-12-27 NOTE — Patient Instructions (Signed)
-  Nice seeing you today!!  -See you back in 6 months for your physical. 

## 2022-01-03 ENCOUNTER — Ambulatory Visit (INDEPENDENT_AMBULATORY_CARE_PROVIDER_SITE_OTHER)
Admission: RE | Admit: 2022-01-03 | Discharge: 2022-01-03 | Disposition: A | Discharge status: Home / Self Care | Payer: Medicare Other | Source: Ambulatory Visit | Attending: Internal Medicine | Admitting: Internal Medicine

## 2022-01-03 DIAGNOSIS — M810 Age-related osteoporosis without current pathological fracture: Secondary | ICD-10-CM

## 2022-01-03 DIAGNOSIS — M8580 Other specified disorders of bone density and structure, unspecified site: Secondary | ICD-10-CM | POA: Diagnosis not present

## 2022-01-05 ENCOUNTER — Other Ambulatory Visit: Payer: Self-pay | Admitting: Internal Medicine

## 2022-01-05 DIAGNOSIS — H5203 Hypermetropia, bilateral: Secondary | ICD-10-CM | POA: Diagnosis not present

## 2022-01-05 DIAGNOSIS — H2513 Age-related nuclear cataract, bilateral: Secondary | ICD-10-CM | POA: Diagnosis not present

## 2022-01-05 DIAGNOSIS — J4531 Mild persistent asthma with (acute) exacerbation: Secondary | ICD-10-CM

## 2022-01-13 ENCOUNTER — Other Ambulatory Visit: Payer: Self-pay | Admitting: Internal Medicine

## 2022-01-13 DIAGNOSIS — J4531 Mild persistent asthma with (acute) exacerbation: Secondary | ICD-10-CM

## 2022-01-18 ENCOUNTER — Telehealth (INDEPENDENT_AMBULATORY_CARE_PROVIDER_SITE_OTHER): Payer: Medicare Other | Admitting: Internal Medicine

## 2022-01-18 DIAGNOSIS — M810 Age-related osteoporosis without current pathological fracture: Secondary | ICD-10-CM | POA: Diagnosis not present

## 2022-01-18 NOTE — Progress Notes (Signed)
Virtual Visit via Video Note  I connected with Wendy Yates on 01/18/22 at 11:30 AM EDT by a video enabled telemedicine application and verified that I am speaking with the correct person using two identifiers.  Location patient: home Location provider: work office Persons participating in the virtual visit: patient, provider  I discussed the limitations of evaluation and management by telemedicine and the availability of in person appointments. The patient expressed understanding and agreed to proceed.   HPI: We have scheduled this visit to discuss the results of her DEXA scan.  Unfortunately she has progressed to osteoporosis with a right femoral neck T score of -2.7.  She tells me that since I last saw her she visited her ophthalmologist and has been diagnosed with cataracts and was told she will most likely need repair later this summer.  She has been dealing with what sounds like a URI with lots of nasal congestion and runny nose.   ROS: Constitutional: Denies fever, chills, diaphoresis, appetite change and fatigue.  HEENT: Denies photophobia, eye pain, redness, mouth sores, trouble swallowing, neck pain, neck stiffness and tinnitus.   Respiratory: Denies SOB, DOE,  chest tightness,  and wheezing.   Cardiovascular: Denies chest pain, palpitations and leg swelling.  Gastrointestinal: Denies nausea, vomiting, abdominal pain, diarrhea, constipation, blood in stool and abdominal distention.  Genitourinary: Denies dysuria, urgency, frequency, hematuria, flank pain and difficulty urinating.  Endocrine: Denies: hot or cold intolerance, sweats, changes in hair or nails, polyuria, polydipsia. Musculoskeletal: Denies myalgias, back pain, joint swelling, arthralgias and gait problem.  Skin: Denies pallor, rash and wound.  Neurological: Denies dizziness, seizures, syncope, weakness, light-headedness, numbness and headaches.  Hematological: Denies adenopathy. Easy bruising, personal or  family bleeding history  Psychiatric/Behavioral: Denies suicidal ideation, mood changes, confusion, nervousness, sleep disturbance and agitation   Past Medical History:  Diagnosis Date   Allergic rhinitis, cause unspecified    Anxiety    Asthma, extrinsic    Depression    Hyperlipidemia    Hypertension    Osteopenia    DEXA 02/2012: -2.0; 12/19/07: -2.1; 08/17/04: -1.6   Post-polio syndrome    polio age 60,  RLE most affected   Scoliosis    1979 harrington rod surgery   Urine incontinence     Past Surgical History:  Procedure Laterality Date   BACK SURGERY  1979   harrington rod inserted   BREAST BIOPSY     CHOLECYSTECTOMY  1969   Left hammertoe repair     Multiple surgeries to allow her to walk post polio     TONSILLECTOMY  1952   TUBAL LIGATION      Family History  Problem Relation Age of Onset   Alcohol abuse Mother    Arthritis Mother    Arthritis Father    Colon cancer Neg Hx     SOCIAL HX:   reports that she quit smoking about 12 years ago. Her smoking use included cigarettes. She has a 5.00 pack-year smoking history. She has never used smokeless tobacco. She reports current alcohol use. She reports that she does not use drugs.   Current Outpatient Medications:    albuterol (PROVENTIL) (2.5 MG/3ML) 0.083% nebulizer solution, Take 3 mLs (2.5 mg total) by nebulization 4 (four) times daily as needed., Disp: 75 mL, Rfl: 3   albuterol (VENTOLIN HFA) 108 (90 Base) MCG/ACT inhaler, USE 2 INHALATIONS BY MOUTH  EVERY 6 HOURS AS NEEDED FOR WHEEZING OR SHORTNESS OF  BREATH, Disp: 34 g, Rfl: 3  atorvastatin (LIPITOR) 10 MG tablet, TAKE 1 TABLET BY MOUTH  DAILY, Disp: 90 tablet, Rfl: 1   Barberry-Oreg Grape-Goldenseal (BERBERINE COMPLEX PO), Take by mouth., Disp: , Rfl:    Bioflavonoid Products (ESTER-C) TABS, Take by mouth daily., Disp: , Rfl:    busPIRone (BUSPAR) 15 MG tablet, TAKE 1 TABLET BY MOUTH TWICE  DAILY, Disp: 180 tablet, Rfl: 1   cyanocobalamin 1000 MCG tablet,  Take 2,000 mcg by mouth daily., Disp: , Rfl:    losartan (COZAAR) 100 MG tablet, TAKE 1 TABLET BY MOUTH  DAILY, Disp: 90 tablet, Rfl: 1   magnesium 30 MG tablet, Take 30 mg by mouth 2 (two) times daily., Disp: , Rfl:    montelukast (SINGULAIR) 10 MG tablet, TAKE 1 TABLET BY MOUTH AT  BEDTIME, Disp: 90 tablet, Rfl: 1   sertraline (ZOLOFT) 50 MG tablet, TAKE 1 TABLET BY MOUTH  DAILY, Disp: 90 tablet, Rfl: 1   SYMBICORT 160-4.5 MCG/ACT inhaler, USE 2 INHALATIONS BY MOUTH TWICE DAILY, Disp: 10.2 g, Rfl: 5  EXAM:   VITALS per patient if applicable: None reported  GENERAL: alert, oriented, appears well and in no acute distress, sounds congested  HEENT: atraumatic, conjunttiva clear, no obvious abnormalities on inspection of external nose and ears  NECK: normal movements of the head and neck  LUNGS: on inspection no signs of respiratory distress, breathing rate appears normal, no obvious gross increased work of breathing, gasping or wheezing  CV: no obvious cyanosis  MS: moves all visible extremities without noticeable abnormality  PSYCH/NEURO: pleasant and cooperative, no obvious depression or anxiety, speech and thought processing grossly intact  ASSESSMENT AND PLAN:   Age-related osteoporosis without current pathological fracture -We have decided to try Prolia.  I have sent a treatment plan to the infusion center.  She has an initial appointment with a dentist already scheduled over the summer. -Recheck DEXA in 2 years.    I discussed the assessment and treatment plan with the patient. The patient was provided an opportunity to ask questions and all were answered. The patient agreed with the plan and demonstrated an understanding of the instructions.   The patient was advised to call back or seek an in-person evaluation if the symptoms worsen or if the condition fails to improve as anticipated.    Chaya Jan, MD  [facility]

## 2022-02-01 ENCOUNTER — Encounter: Payer: Self-pay | Admitting: Internal Medicine

## 2022-02-02 NOTE — Telephone Encounter (Signed)
Note sent to the infusion clinic

## 2022-03-24 ENCOUNTER — Other Ambulatory Visit: Payer: Self-pay | Admitting: Internal Medicine

## 2022-03-30 DIAGNOSIS — H25043 Posterior subcapsular polar age-related cataract, bilateral: Secondary | ICD-10-CM | POA: Diagnosis not present

## 2022-03-30 DIAGNOSIS — H18413 Arcus senilis, bilateral: Secondary | ICD-10-CM | POA: Diagnosis not present

## 2022-03-30 DIAGNOSIS — H2512 Age-related nuclear cataract, left eye: Secondary | ICD-10-CM | POA: Diagnosis not present

## 2022-03-30 DIAGNOSIS — H25013 Cortical age-related cataract, bilateral: Secondary | ICD-10-CM | POA: Diagnosis not present

## 2022-03-30 DIAGNOSIS — H2513 Age-related nuclear cataract, bilateral: Secondary | ICD-10-CM | POA: Diagnosis not present

## 2022-04-01 ENCOUNTER — Other Ambulatory Visit: Payer: Self-pay | Admitting: Internal Medicine

## 2022-04-17 ENCOUNTER — Other Ambulatory Visit: Payer: Self-pay | Admitting: Internal Medicine

## 2022-05-03 DIAGNOSIS — H2512 Age-related nuclear cataract, left eye: Secondary | ICD-10-CM | POA: Diagnosis not present

## 2022-05-04 DIAGNOSIS — H2511 Age-related nuclear cataract, right eye: Secondary | ICD-10-CM | POA: Diagnosis not present

## 2022-05-10 DIAGNOSIS — Z961 Presence of intraocular lens: Secondary | ICD-10-CM | POA: Diagnosis not present

## 2022-05-10 DIAGNOSIS — H52222 Regular astigmatism, left eye: Secondary | ICD-10-CM | POA: Diagnosis not present

## 2022-05-10 DIAGNOSIS — H2512 Age-related nuclear cataract, left eye: Secondary | ICD-10-CM | POA: Diagnosis not present

## 2022-05-17 DIAGNOSIS — H2511 Age-related nuclear cataract, right eye: Secondary | ICD-10-CM | POA: Diagnosis not present

## 2022-05-24 DIAGNOSIS — H2511 Age-related nuclear cataract, right eye: Secondary | ICD-10-CM | POA: Diagnosis not present

## 2022-05-24 DIAGNOSIS — Z961 Presence of intraocular lens: Secondary | ICD-10-CM | POA: Diagnosis not present

## 2022-05-24 DIAGNOSIS — H52223 Regular astigmatism, bilateral: Secondary | ICD-10-CM | POA: Diagnosis not present

## 2022-06-07 ENCOUNTER — Other Ambulatory Visit: Payer: Self-pay | Admitting: Internal Medicine

## 2022-06-08 NOTE — Telephone Encounter (Signed)
Chart accessed in error.   

## 2022-06-14 ENCOUNTER — Encounter: Payer: Self-pay | Admitting: Gastroenterology

## 2022-06-29 ENCOUNTER — Encounter: Payer: Self-pay | Admitting: Internal Medicine

## 2022-06-29 ENCOUNTER — Ambulatory Visit (INDEPENDENT_AMBULATORY_CARE_PROVIDER_SITE_OTHER): Payer: Medicare Other | Admitting: Internal Medicine

## 2022-06-29 VITALS — BP 128/78 | HR 76 | Temp 97.6°F | Ht 59.5 in | Wt 184.1 lb

## 2022-06-29 DIAGNOSIS — M810 Age-related osteoporosis without current pathological fracture: Secondary | ICD-10-CM | POA: Diagnosis not present

## 2022-06-29 DIAGNOSIS — E782 Mixed hyperlipidemia: Secondary | ICD-10-CM

## 2022-06-29 DIAGNOSIS — R7302 Impaired glucose tolerance (oral): Secondary | ICD-10-CM

## 2022-06-29 DIAGNOSIS — M419 Scoliosis, unspecified: Secondary | ICD-10-CM | POA: Diagnosis not present

## 2022-06-29 DIAGNOSIS — I10 Essential (primary) hypertension: Secondary | ICD-10-CM | POA: Diagnosis not present

## 2022-06-29 DIAGNOSIS — Z0000 Encounter for general adult medical examination without abnormal findings: Secondary | ICD-10-CM | POA: Diagnosis not present

## 2022-06-29 DIAGNOSIS — G14 Postpolio syndrome: Secondary | ICD-10-CM | POA: Diagnosis not present

## 2022-06-29 DIAGNOSIS — Z23 Encounter for immunization: Secondary | ICD-10-CM | POA: Diagnosis not present

## 2022-06-29 LAB — CBC WITH DIFFERENTIAL/PLATELET
Basophils Absolute: 0.1 10*3/uL (ref 0.0–0.1)
Basophils Relative: 2.1 % (ref 0.0–3.0)
Eosinophils Absolute: 0.3 10*3/uL (ref 0.0–0.7)
Eosinophils Relative: 6.2 % — ABNORMAL HIGH (ref 0.0–5.0)
HCT: 44 % (ref 36.0–46.0)
Hemoglobin: 14.4 g/dL (ref 12.0–15.0)
Lymphocytes Relative: 38.5 % (ref 12.0–46.0)
Lymphs Abs: 1.6 10*3/uL (ref 0.7–4.0)
MCHC: 32.6 g/dL (ref 30.0–36.0)
MCV: 93.7 fl (ref 78.0–100.0)
Monocytes Absolute: 0.6 10*3/uL (ref 0.1–1.0)
Monocytes Relative: 14.3 % — ABNORMAL HIGH (ref 3.0–12.0)
Neutro Abs: 1.6 10*3/uL (ref 1.4–7.7)
Neutrophils Relative %: 38.9 % — ABNORMAL LOW (ref 43.0–77.0)
Platelets: 271 10*3/uL (ref 150.0–400.0)
RBC: 4.7 Mil/uL (ref 3.87–5.11)
RDW: 13.4 % (ref 11.5–15.5)
WBC: 4.1 10*3/uL (ref 4.0–10.5)

## 2022-06-29 LAB — COMPREHENSIVE METABOLIC PANEL
ALT: 12 U/L (ref 0–35)
AST: 22 U/L (ref 0–37)
Albumin: 4.3 g/dL (ref 3.5–5.2)
Alkaline Phosphatase: 87 U/L (ref 39–117)
BUN: 15 mg/dL (ref 6–23)
CO2: 31 mEq/L (ref 19–32)
Calcium: 9.1 mg/dL (ref 8.4–10.5)
Chloride: 97 mEq/L (ref 96–112)
Creatinine, Ser: 0.48 mg/dL (ref 0.40–1.20)
GFR: 91.64 mL/min (ref >60.00)
Glucose, Bld: 93 mg/dL (ref 70–99)
Potassium: 3.6 mEq/L (ref 3.5–5.1)
Sodium: 134 mEq/L — ABNORMAL LOW (ref 135–145)
Total Bilirubin: 0.9 mg/dL (ref 0.2–1.2)
Total Protein: 7.1 g/dL (ref 6.0–8.3)

## 2022-06-29 LAB — VITAMIN B12: Vitamin B-12: >1500 pg/mL — ABNORMAL HIGH (ref 211–911)

## 2022-06-29 LAB — LIPID PANEL
Cholesterol: 131 mg/dL (ref 0–200)
HDL: 52.8 mg/dL (ref >39.00)
LDL Cholesterol: 60 mg/dL (ref 0–99)
NonHDL: 78.33
Total CHOL/HDL Ratio: 2
Triglycerides: 91 mg/dL (ref 0.0–149.0)
VLDL: 18.2 mg/dL (ref 0.0–40.0)

## 2022-06-29 LAB — TSH: TSH: 1.84 u[IU]/mL (ref 0.35–5.50)

## 2022-06-29 LAB — HEMOGLOBIN A1C: Hgb A1c MFr Bld: 6.1 % (ref 4.6–6.5)

## 2022-06-29 LAB — VITAMIN D 25 HYDROXY (VIT D DEFICIENCY, FRACTURES): VITD: 68.65 ng/mL (ref 30.00–100.00)

## 2022-06-29 NOTE — Progress Notes (Signed)
Established Patient Office Visit     CC/Reason for Visit: Annual preventive exam and subsequent Medicare wellness visit  HPI: Wendy Yates is a 76 y.o. female who is coming in today for the above mentioned reasons. Past Medical History is significant for: Hypertension, hyperlipidemia, impaired glucose tolerance, depression and mild intermittent asthma.  Her only complaint is some mild hair loss.  Her eye and dental exams are up-to-date.  She is requesting a flu vaccine.  She elects to defer all cancer screening due to age and comorbidities.   Past Medical/Surgical History: Past Medical History:  Diagnosis Date   Allergic rhinitis, cause unspecified    Anxiety    Asthma, extrinsic    Depression    Hyperlipidemia    Hypertension    Osteopenia    DEXA 02/2012: -2.0; 12/19/07: -2.1; 08/17/04: -1.6   Post-polio syndrome    polio age 13,  RLE most affected   Scoliosis    1979 harrington rod surgery   Urine incontinence     Past Surgical History:  Procedure Laterality Date   BACK SURGERY  1979   harrington rod inserted   BREAST BIOPSY     CHOLECYSTECTOMY  1969   Left hammertoe repair     Multiple surgeries to allow her to walk post polio     TONSILLECTOMY  1952   TUBAL LIGATION      Social History:  reports that she quit smoking about 12 years ago. Her smoking use included cigarettes. She has a 5.00 pack-year smoking history. She has never used smokeless tobacco. She reports current alcohol use. She reports that she does not use drugs.  Allergies: Allergies  Allergen Reactions   Morphine And Related Other (See Comments)    "crazy"   Sulfa Antibiotics Swelling and Other (See Comments)    "swollen joints and extreme pain"    Family History:  Family History  Problem Relation Age of Onset   Alcohol abuse Mother    Arthritis Mother    Arthritis Father    Colon cancer Neg Hx      Current Outpatient Medications:    albuterol (PROVENTIL) (2.5 MG/3ML) 0.083%  nebulizer solution, Take 3 mLs (2.5 mg total) by nebulization 4 (four) times daily as needed., Disp: 75 mL, Rfl: 3   albuterol (VENTOLIN HFA) 108 (90 Base) MCG/ACT inhaler, USE 2 INHALATIONS BY MOUTH  EVERY 6 HOURS AS NEEDED FOR WHEEZING OR SHORTNESS OF  BREATH, Disp: 34 g, Rfl: 2   atorvastatin (LIPITOR) 10 MG tablet, TAKE 1 TABLET BY MOUTH DAILY, Disp: 100 tablet, Rfl: 1   Barberry-Oreg Grape-Goldenseal (BERBERINE COMPLEX PO), Take by mouth., Disp: , Rfl:    Bioflavonoid Products (ESTER-C) TABS, Take by mouth daily., Disp: , Rfl:    busPIRone (BUSPAR) 15 MG tablet, TAKE 1 TABLET BY MOUTH TWICE  DAILY, Disp: 200 tablet, Rfl: 1   cyanocobalamin 1000 MCG tablet, Take 2,000 mcg by mouth daily., Disp: , Rfl:    losartan (COZAAR) 100 MG tablet, TAKE 1 TABLET BY MOUTH DAILY, Disp: 100 tablet, Rfl: 1   magnesium 30 MG tablet, Take 30 mg by mouth 2 (two) times daily., Disp: , Rfl:    montelukast (SINGULAIR) 10 MG tablet, TAKE 1 TABLET BY MOUTH AT  BEDTIME, Disp: 100 tablet, Rfl: 1   sertraline (ZOLOFT) 50 MG tablet, TAKE 1 TABLET BY MOUTH DAILY, Disp: 90 tablet, Rfl: 0   SYMBICORT 160-4.5 MCG/ACT inhaler, USE 2 INHALATIONS BY MOUTH TWICE DAILY, Disp: 10.2 g,  Rfl: 5  Review of Systems:  Constitutional: Denies fever, chills, diaphoresis, appetite change and fatigue.  HEENT: Denies photophobia, eye pain, redness, hearing loss, ear pain, congestion, sore throat, rhinorrhea, sneezing, mouth sores, trouble swallowing, neck pain, neck stiffness and tinnitus.   Respiratory: Denies SOB, DOE, cough, chest tightness,  and wheezing.   Cardiovascular: Denies chest pain, palpitations and leg swelling.  Gastrointestinal: Denies nausea, vomiting, abdominal pain, diarrhea, constipation, blood in stool and abdominal distention.  Genitourinary: Denies dysuria, urgency, frequency, hematuria, flank pain and difficulty urinating.  Endocrine: Denies: hot or cold intolerance, sweats, changes in nails, polyuria,  polydipsia. Skin: Denies pallor, rash and wound.  Neurological: Denies dizziness, seizures, syncope, weakness, light-headedness, numbness and headaches.  Hematological: Denies adenopathy. Easy bruising, personal or family bleeding history  Psychiatric/Behavioral: Denies suicidal ideation, mood changes, confusion, nervousness, sleep disturbance and agitation    Physical Exam: Vitals:   06/29/22 0807 06/29/22 0819  BP: 130/80 128/78  Pulse: 76   Temp: 97.6 F (36.4 C)   TempSrc: Oral   SpO2: 95%   Weight: 184 lb 1.6 oz (83.5 kg)   Height: 4' 11.5" (1.511 m)     Body mass index is 36.56 kg/m.   Constitutional: NAD, calm, comfortable Eyes: PERRL, lids and conjunctivae normal ENMT: Mucous membranes are moist. Posterior pharynx clear of any exudate or lesions. Normal dentition. Tympanic membrane is pearly white, no erythema or bulging. Neck: normal, supple, no masses, no thyromegaly Respiratory: clear to auscultation bilaterally, no wheezing, no crackles. Normal respiratory effort. No accessory muscle use.  Cardiovascular: Regular rate and rhythm, no murmurs / rubs / gallops. No extremity edema. 2+ pedal pulses. No carotid bruits.  Abdomen: no tenderness, no masses palpated. No hepatosplenomegaly. Bowel sounds positive.  Musculoskeletal: no clubbing / cyanosis. No joint deformity upper and lower extremities. Good ROM, no contractures. Normal muscle tone.  Skin: no rashes, lesions, ulcers. No induration Neurologic: CN 2-12 grossly intact. Sensation intact, DTR normal. Strength 5/5 in all 4.  Psychiatric: Normal judgment and insight. Alert and oriented x 3. Normal mood.   Subsequent Medicare wellness visit   1. Risk factors, based on past  M,S,F -cardiovascular disease risk factors include age, history of hypertension and hyperlipidemia   2.  Physical activities: Activities of daily living only due to severe scoliosis and postpolio   3.  Depression/mood: History of depression but  mood is stable   4.  Hearing: Mild decreased hearing left ear   5.  ADL's: Independent in all ADLs   6.  Fall risk: Low to moderate fall risk   7.  Home safety: No problems identified   8.  Height weight, and visual acuity: height and weight as above, vision:  Vision Screening   Right eye Left eye Both eyes  Without correction 20/25 20/20 20/20   With correction        9.  Counseling: Advised to update all age-appropriate immunizations   10. Lab orders based on risk factors: Laboratory update will be reviewed   11. Referral : None today   12. Care plan: Follow-up with me in 6 months   13. Cognitive assessment: No cognitive impairment   14. Screening: Patient provided with a written and personalized 5-10 year screening schedule in the AVS. yes   15. Provider List Update: PCP, psychiatrist, ophthalmologist  16. Advance Directives: Full code   17. Opioids: Patient is not on any opioid prescriptions and has no risk factors for a substance use disorder.   Flowsheet  Visit from 06/29/2022 in [facility] at [place]  PHQ-9 Total Score 8          08/19/2020    8:04 AM 06/28/2021    2:37 PM 12/26/2021   12:03 PM 12/27/2021    8:39 AM 06/29/2022    8:04 AM  Fall Risk  Falls in the past year? 1 1 1 1 1   Was there an injury with Fall? 1 0 0 0 0  Fall Risk Category Calculator 3 1 2 2 1   Fall Risk Category High Low Moderate Moderate Low  Patient Fall Risk Level  Moderate fall risk  Moderate fall risk Low fall risk  Patient at Risk for Falls Due to  History of fall(s)  Impaired balance/gait Impaired balance/gait  Fall risk Follow up    Falls evaluation completed Falls evaluation completed      Impression and Plan:  Encounter for preventive health examination  Essential hypertension - Plan: CBC with Differential/Platelet, Comprehensive metabolic panel  Scoliosis of thoracolumbar spine, unspecified scoliosis type  Post-polio syndrome  IGT (impaired  glucose tolerance) - Plan: Hemoglobin A1c, TSH, Vitamin B12, VITAMIN D 25 Hydroxy (Vit-D Deficiency, Fractures)  Age-related osteoporosis without current pathological fracture  Mixed hyperlipidemia - Plan: Lipid panel  -Recommend routine eye and dental care. -Immunizations: Flu vaccine in office today, advised COVID vaccination at pharmacy -Healthy lifestyle discussed in detail. -Labs to be updated today. -Colon cancer screening: 03/2012, no further due to age -Breast cancer screening: Declines due to age -Cervical cancer screening: Declines due to age -Lung cancer screening: Not applicable -Prostate cancer screening: Not applicable -DEXA: 12/2021     04/2012, MD [facility]

## 2022-07-18 ENCOUNTER — Other Ambulatory Visit: Payer: Self-pay | Admitting: Internal Medicine

## 2022-09-20 ENCOUNTER — Other Ambulatory Visit: Payer: Self-pay | Admitting: Internal Medicine

## 2022-09-20 DIAGNOSIS — J4531 Mild persistent asthma with (acute) exacerbation: Secondary | ICD-10-CM

## 2022-10-05 ENCOUNTER — Other Ambulatory Visit: Payer: Self-pay | Admitting: Internal Medicine

## 2022-10-13 ENCOUNTER — Other Ambulatory Visit: Payer: Self-pay | Admitting: Internal Medicine

## 2023-01-07 ENCOUNTER — Other Ambulatory Visit: Payer: Self-pay | Admitting: Internal Medicine

## 2023-01-24 ENCOUNTER — Other Ambulatory Visit: Payer: Self-pay | Admitting: Internal Medicine

## 2023-04-05 ENCOUNTER — Emergency Department (HOSPITAL_BASED_OUTPATIENT_CLINIC_OR_DEPARTMENT_OTHER)
Admission: EM | Admit: 2023-04-05 | Discharge: 2023-04-05 | Disposition: A | Discharge status: Home / Self Care | Payer: Medicare Other | Attending: Emergency Medicine | Admitting: Emergency Medicine

## 2023-04-05 ENCOUNTER — Other Ambulatory Visit: Payer: Self-pay

## 2023-04-05 ENCOUNTER — Encounter (HOSPITAL_BASED_OUTPATIENT_CLINIC_OR_DEPARTMENT_OTHER): Payer: Self-pay

## 2023-04-05 DIAGNOSIS — Z20822 Contact with and (suspected) exposure to covid-19: Secondary | ICD-10-CM | POA: Insufficient documentation

## 2023-04-05 DIAGNOSIS — R030 Elevated blood-pressure reading, without diagnosis of hypertension: Secondary | ICD-10-CM

## 2023-04-05 DIAGNOSIS — R059 Cough, unspecified: Secondary | ICD-10-CM | POA: Insufficient documentation

## 2023-04-05 DIAGNOSIS — R0981 Nasal congestion: Secondary | ICD-10-CM | POA: Diagnosis not present

## 2023-04-05 DIAGNOSIS — I10 Essential (primary) hypertension: Secondary | ICD-10-CM | POA: Insufficient documentation

## 2023-04-05 DIAGNOSIS — R42 Dizziness and giddiness: Secondary | ICD-10-CM

## 2023-04-05 LAB — COMPREHENSIVE METABOLIC PANEL
ALT: 11 U/L (ref 0–44)
AST: 28 U/L (ref 15–41)
Albumin: 4.8 g/dL (ref 3.5–5.0)
Alkaline Phosphatase: 98 U/L (ref 38–126)
Anion gap: 8 (ref 5–15)
BUN: 15 mg/dL (ref 8–23)
CO2: 30 mmol/L (ref 22–32)
Calcium: 9.7 mg/dL (ref 8.9–10.3)
Chloride: 96 mmol/L — ABNORMAL LOW (ref 98–111)
Creatinine, Ser: 0.46 mg/dL (ref 0.44–1.00)
GFR, Estimated: >60 mL/min (ref >60)
Glucose, Bld: 104 mg/dL — ABNORMAL HIGH (ref 70–99)
Potassium: 4 mmol/L (ref 3.5–5.1)
Sodium: 134 mmol/L — ABNORMAL LOW (ref 135–145)
Total Bilirubin: 1 mg/dL (ref 0.3–1.2)
Total Protein: 8.2 g/dL — ABNORMAL HIGH (ref 6.5–8.1)

## 2023-04-05 LAB — CBC
HCT: 49 % — ABNORMAL HIGH (ref 36.0–46.0)
Hemoglobin: 16 g/dL — ABNORMAL HIGH (ref 12.0–15.0)
MCH: 30.2 pg (ref 26.0–34.0)
MCHC: 32.7 g/dL (ref 30.0–36.0)
MCV: 92.5 fL (ref 80.0–100.0)
Platelets: 289 10*3/uL (ref 150–400)
RBC: 5.3 MIL/uL — ABNORMAL HIGH (ref 3.87–5.11)
RDW: 13.2 % (ref 11.5–15.5)
WBC: 3.4 10*3/uL — ABNORMAL LOW (ref 4.0–10.5)
nRBC: 0 % (ref 0.0–0.2)

## 2023-04-05 LAB — SARS CORONAVIRUS 2 BY RT PCR: SARS Coronavirus 2 by RT PCR: NEGATIVE

## 2023-04-05 MED ORDER — MECLIZINE HCL 12.5 MG PO TABS: 12.5000 mg | ORAL_TABLET | Freq: Three times a day (TID) | ORAL | 0 refills | Status: DC | PRN

## 2023-04-05 MED ORDER — MECLIZINE HCL 25 MG PO TABS
12.5000 mg | ORAL_TABLET | Freq: Once | ORAL | Status: AC
  Administered 2023-04-05: 12.5 mg via ORAL
  Filled 2023-04-05: qty 1

## 2023-04-05 MED ORDER — LACTATED RINGERS IV BOLUS
1000.0000 mL | Freq: Once | INTRAVENOUS | Status: AC
  Administered 2023-04-05: 1000 mL via INTRAVENOUS

## 2023-04-05 MED ORDER — ONDANSETRON HCL 4 MG/2ML IJ SOLN
4.0000 mg | Freq: Once | INTRAMUSCULAR | Status: AC
  Administered 2023-04-05: 4 mg via INTRAVENOUS
  Filled 2023-04-05: qty 2

## 2023-04-05 MED ORDER — ONDANSETRON 8 MG PO TBDP: 8.0000 mg | ORAL_TABLET | Freq: Three times a day (TID) | ORAL | 0 refills | Status: DC | PRN

## 2023-04-05 NOTE — ED Provider Notes (Signed)
EMERGENCY DEPARTMENT AT [facility] Provider Note   CSN: [id number] Arrival date & time: 04/05/23  1435     History  Chief Complaint  Patient presents with   Dizziness   Nausea    RONNAE OSMOND is a 77 y.o. female.  Pt with c/o dizziness. States awoke this AM and felt room spinning, and also onset nausea/vomiting. Emesis not bloody or bilious. No abd pain or distension. Recent mild nasal congestion, mild non prod cough - took a mucinex last evening. No ear pain, tinnitus, or hearing loss. No dizziness yesterday. No lightheadedness or syncope. Dizziness worse w change in position or head movement. No change in speech or vision. No numbness/weakness. No headache. ?hx mild vertigo in past but not to this extent. No fever or chills. No headache. No change in meds or new meds. Not able to ambulate due to dizziness.   The history is provided by the patient, a relative and medical records.  Dizziness Associated symptoms: no chest pain, no diarrhea, no headaches, no hearing loss, no shortness of breath, no tinnitus, no vomiting and no weakness        Home Medications Prior to Admission medications   Medication Sig Start Date End Date Taking? Authorizing Provider  albuterol (PROVENTIL) (2.5 MG/3ML) 0.083% nebulizer solution Take 3 mLs (2.5 mg total) by nebulization 4 (four) times daily as needed. 12/27/21   Philip Aspen, Limmie Patricia, MD  albuterol (VENTOLIN HFA) 108 (90 Base) MCG/ACT inhaler USE 2 INHALATIONS BY MOUTH EVERY 6 HOURS AS NEEDED FOR WHEEZING  OR SHORTNESS OF BREATH 01/08/23   Philip Aspen, Limmie Patricia, MD  atorvastatin (LIPITOR) 10 MG tablet TAKE 1 TABLET BY MOUTH DAILY 10/05/22   Philip Aspen, Limmie Patricia, MD  Barberry-Oreg Grape-Goldenseal (BERBERINE COMPLEX PO) Take by mouth.    [provider]  Bioflavonoid Products (ESTER-C) TABS Take by mouth daily.    [provider]  busPIRone (BUSPAR) 15 MG tablet TAKE 1 TABLET BY MOUTH TWICE   DAILY 10/16/22   Philip Aspen, Limmie Patricia, MD  cyanocobalamin 1000 MCG tablet Take 2,000 mcg by mouth daily.    [provider]  losartan (COZAAR) 100 MG tablet TAKE 1 TABLET BY MOUTH DAILY 10/05/22   Philip Aspen, Limmie Patricia, MD  magnesium 30 MG tablet Take 30 mg by mouth 2 (two) times daily.    [provider]  montelukast (SINGULAIR) 10 MG tablet TAKE 1 TABLET BY MOUTH AT  BEDTIME 10/05/22   Philip Aspen, Limmie Patricia, MD  sertraline (ZOLOFT) 50 MG tablet TAKE 1 TABLET BY MOUTH DAILY 01/25/23   Philip Aspen, Limmie Patricia, MD  SYMBICORT 160-4.5 MCG/ACT inhaler USE 2 INHALATIONS BY MOUTH TWICE DAILY 09/20/22   Philip Aspen, Limmie Patricia, MD      Allergies    Morphine and codeine and Sulfa antibiotics    Review of Systems   Review of Systems  Constitutional:  Negative for chills and fever.  HENT:  Positive for congestion. Negative for ear pain, hearing loss, sore throat and tinnitus.   Eyes:  Negative for redness and visual disturbance.  Respiratory:  Negative for shortness of breath.   Cardiovascular:  Negative for chest pain.  Gastrointestinal:  Negative for abdominal pain, diarrhea and vomiting.  Genitourinary:  Negative for dysuria and flank pain.  Musculoskeletal:  Negative for back pain and neck pain.  Skin:  Negative for rash.  Neurological:  Positive for dizziness. Negative for syncope, speech difficulty, weakness, numbness and headaches.  Hematological:  Does not bruise/bleed easily.  Psychiatric/Behavioral:  Negative for confusion.     Physical Exam Updated Vital Signs BP (!) 201/98   Pulse 82   Resp 16   SpO2 98%  Physical Exam Vitals and nursing note reviewed.  Constitutional:      Appearance: Normal appearance. She is well-developed.  HENT:     Head: Atraumatic.     Right Ear: Tympanic membrane normal.     Left Ear: Tympanic membrane normal.     Nose: Nose normal.     Mouth/Throat:     Mouth: Mucous membranes are moist.  Eyes:     General: No  scleral icterus.    Conjunctiva/sclera: Conjunctivae normal.     Pupils: Pupils are equal, round, and reactive to light.  Neck:     Vascular: No carotid bruit.     Trachea: No tracheal deviation.  Cardiovascular:     Rate and Rhythm: Normal rate and regular rhythm.     Pulses: Normal pulses.     Heart sounds: Normal heart sounds. No murmur heard.    No friction rub. No gallop.  Pulmonary:     Effort: Pulmonary effort is normal. No respiratory distress.     Breath sounds: Normal breath sounds.  Abdominal:     General: Bowel sounds are normal. There is no distension.     Palpations: Abdomen is soft. There is no mass.     Tenderness: There is no abdominal tenderness. There is no guarding.  Genitourinary:    Comments: No cva tenderness.  Musculoskeletal:        General: No swelling or tenderness.     Cervical back: Normal range of motion and neck supple. No rigidity. No muscular tenderness.     Right lower leg: No edema.     Left lower leg: No edema.  Skin:    General: Skin is warm and dry.     Findings: No rash.  Neurological:     Mental Status: She is alert and oriented to person, place, and time.     Cranial Nerves: No cranial nerve deficit.     Comments: Alert, speech normal. Motor/sens grossly intact bil. Str 5/5. No pronator drift. No nystagmus.   Psychiatric:        Mood and Affect: Mood normal.     ED Results / Procedures / Treatments   Labs (all labs ordered are listed, but only abnormal results are displayed) Results for orders placed or performed in visit on 06/29/22  VITAMIN D 25 Hydroxy (Vit-D Deficiency, Fractures)  Result Value Ref Range   VITD 68.65 30.00 - 100.00 ng/mL  Vitamin B12  Result Value Ref Range   Vitamin B-12 >1500 (H) 211 - 911 pg/mL  TSH  Result Value Ref Range   TSH 1.84 0.35 - 5.50 uIU/mL  Lipid panel  Result Value Ref Range   Cholesterol 131 0 - 200 mg/dL   Triglycerides 40.9 0.0 - 149.0 mg/dL   HDL 81.19 >14.78 mg/dL   VLDL 29.5 0.0  - 62.1 mg/dL   LDL Cholesterol 60 0 - 99 mg/dL   Total CHOL/HDL Ratio 2    NonHDL 78.33   Hemoglobin A1c  Result Value Ref Range   Hgb A1c MFr Bld 6.1 4.6 - 6.5 %  Comprehensive metabolic panel  Result Value Ref Range   Sodium 134 (L) 135 - 145 mEq/L   Potassium 3.6 3.5 - 5.1 mEq/L   Chloride 97 96 - 112 mEq/L   CO2 31 19 -  32 mEq/L   Glucose, Bld 93 70 - 99 mg/dL   BUN 15 6 - 23 mg/dL   Creatinine, Ser 1.61 0.40 - 1.20 mg/dL   Total Bilirubin 0.9 0.2 - 1.2 mg/dL   Alkaline Phosphatase 87 39 - 117 U/L   AST 22 0 - 37 U/L   ALT 12 0 - 35 U/L   Total Protein 7.1 6.0 - 8.3 g/dL   Albumin 4.3 3.5 - 5.2 g/dL   GFR 09.60 >45.40 mL/min   Calcium 9.1 8.4 - 10.5 mg/dL  CBC with Differential/Platelet  Result Value Ref Range   WBC 4.1 4.0 - 10.5 K/uL   RBC 4.70 3.87 - 5.11 Mil/uL   Hemoglobin 14.4 12.0 - 15.0 g/dL   HCT 98.1 19.1 - 47.8 %   MCV 93.7 78.0 - 100.0 fl   MCHC 32.6 30.0 - 36.0 g/dL   RDW 29.5 62.1 - 30.8 %   Platelets 271.0 150.0 - 400.0 K/uL   Neutrophils Relative % 38.9 (L) 43.0 - 77.0 %   Lymphocytes Relative 38.5 12.0 - 46.0 %   Monocytes Relative 14.3 (H) 3.0 - 12.0 %   Eosinophils Relative 6.2 (H) 0.0 - 5.0 %   Basophils Relative 2.1 0.0 - 3.0 %   Neutro Abs 1.6 1.4 - 7.7 K/uL   Lymphs Abs 1.6 0.7 - 4.0 K/uL   Monocytes Absolute 0.6 0.1 - 1.0 K/uL   Eosinophils Absolute 0.3 0.0 - 0.7 K/uL   Basophils Absolute 0.1 0.0 - 0.1 K/uL    EKG EKG Interpretation Date/Time:  Thursday April 05 2023 14:52:16 EDT Ventricular Rate:  80 PR Interval:  183 QRS Duration:  96 QT Interval:  376 QTC Calculation: 434 R Axis:   57  Text Interpretation: Sinus rhythm No previous tracing Confirmed by Cathren Laine ([id number]) on 04/05/2023 2:56:58 PM  Radiology No results found.  Procedures Procedures    Medications Ordered in ED Medications  lactated ringers bolus 1,000 mL (has no administration in time range)  ondansetron (ZOFRAN) injection 4 mg (has no administration  in time range)  meclizine (ANTIVERT) tablet 12.5 mg (has no administration in time range)    ED Course/ Medical Decision Making/ A&P Clinical Course as of 04/07/23 0818  Thu Apr 05, 2023  1609 Covid is negative.  CBC unremarkable. [JK]  1655 Metabolic panel unremarkable. [JK]    Clinical Course User Index [JK] Linwood Dibbles, MD                                 Medical Decision Making Amount and/or Complexity of Data Reviewed Labs: ordered.  Risk Prescription drug management.   Iv ns. Continuous pulse ox and cardiac monitoring. Labs ordered/sent.   Differential diagnosis includes peripheral vertigo, central vertigo, dehydration, uri/viral syndrome, etc. Dispo decision including potential need for admission considered - will get labs and reassess.   Reviewed nursing notes and prior charts for additional history. External reports reviewed. Additional history from: sister.   Pt with hx htn, took meds this AM, and not sure if kept meds down.   Cardiac monitor: sinus rhythm, rate 80.  Antivert po. Zofran iv. LR bolus.   Labs reviewed/interpreted by me - pending.   Labs, fluids/meds pending.   Signed out to Dr Lynelle Doctor to check pending labs, and response to fluids/meds. If patient remains dizzy, and ataxic/unsteady gait, would recommend transfer to [facility] (or [facility]) for MRI.  Final Clinical Impression(s) / ED Diagnoses Final diagnoses:  None    Rx / DC Orders ED Discharge Orders     None         Cathren Laine, MD 04/07/23 [phone]

## 2023-04-05 NOTE — ED Triage Notes (Addendum)
Pt c/o dizziness onset this AM, first thing, associated nausea/ vomiting. Pt states she's been at home/ "not around public in weeks so I don't have a virus." Denies known relevant hx. Pt states neighbor gave "chewable dramamine but probably threw it up." Reports she was able to take daily meds at 9a, has thrown up multiple times after

## 2023-04-05 NOTE — Discharge Instructions (Signed)
Take the medication as needed to help with the dizziness and nausea.  Return to [facility] if you start having trouble with vision, balance coordination or other concerning symptoms as we discussed

## 2023-04-05 NOTE — ED Provider Notes (Signed)
Clinical Course as of 04/05/23 1717  Thu Apr 05, 2023  1609 Covid is negative.  CBC unremarkable. [JK]  1655 Metabolic panel unremarkable. [JK]    Clinical Course User Index [JK] Linwood Dibbles, MD     Patient initially seen by Dr. Denton Lank.  Please see his note.  Plan was to check on patient's labs and see how she responded to treatment with meclizine and Zofran.  Patient states she is feeling much better now.  She feels like her symptoms have resolved.  She is not having any trouble with dizziness or issues with her balance and coordination.  Patient was able to ambulate without difficulty.  We discussed peripheral versus central vertigo.  Discussed MRI testing.  Overall low suspicion for central cause at this time.  Patient is feeling well and would like to go home.  Patient understands that she has any recurrent symptoms or if she starts experiencing trouble with her vision balance or coordination she should go to [facility] immediately.   Linwood Dibbles, MD 04/05/23 [phone]

## 2023-04-06 ENCOUNTER — Emergency Department (HOSPITAL_BASED_OUTPATIENT_CLINIC_OR_DEPARTMENT_OTHER)
Admission: EM | Admit: 2023-04-06 | Discharge: 2023-04-06 | Disposition: A | Discharge status: Home / Self Care | Payer: Medicare Other | Source: Home / Self Care | Attending: Emergency Medicine | Admitting: Emergency Medicine

## 2023-04-06 ENCOUNTER — Other Ambulatory Visit: Payer: Self-pay

## 2023-04-06 ENCOUNTER — Encounter (HOSPITAL_BASED_OUTPATIENT_CLINIC_OR_DEPARTMENT_OTHER): Payer: Self-pay | Admitting: Emergency Medicine

## 2023-04-06 ENCOUNTER — Emergency Department (HOSPITAL_BASED_OUTPATIENT_CLINIC_OR_DEPARTMENT_OTHER): Payer: Medicare Other

## 2023-04-06 DIAGNOSIS — R42 Dizziness and giddiness: Secondary | ICD-10-CM | POA: Diagnosis not present

## 2023-04-06 DIAGNOSIS — R519 Headache, unspecified: Secondary | ICD-10-CM | POA: Diagnosis not present

## 2023-04-06 DIAGNOSIS — I10 Essential (primary) hypertension: Secondary | ICD-10-CM | POA: Diagnosis not present

## 2023-04-06 NOTE — ED Notes (Signed)
Pt driven home by daughters, they states they will take her home to [facility] to care for her at this time and ensure that patient has follow up MRI. Pt taken out in wheelchair.

## 2023-04-06 NOTE — ED Provider Notes (Signed)
[facility] EMERGENCY DEPARTMENT AT [facility] Provider Note   CSN: [id number] Arrival date & time: 04/06/23  1121     History  Chief Complaint  Patient presents with   Dizziness    Wendy Yates is a 77 y.o. female.  Patient with history of polio, anxiety, hypertension.  Patient with dizziness symptoms for the last few days.  Was seen here yesterday had meclizine and Zofran with good improvement.  There was discussion about doing an MRI to further rule out stroke but she declined at that time.  She has been having some ongoing episodes of dizziness and nausea especially when she turns her head to the left.  Had a bad episode this morning and decided to come for MRI.  She denies any weakness speech changes vision loss.  Has been able to ambulate with her walker.  The history is provided by the patient.       Home Medications Prior to Admission medications   Medication Sig Start Date End Date Taking? Authorizing Provider  albuterol (PROVENTIL) (2.5 MG/3ML) 0.083% nebulizer solution Take 3 mLs (2.5 mg total) by nebulization 4 (four) times daily as needed. 12/27/21   Philip Aspen, Limmie Patricia, MD  albuterol (VENTOLIN HFA) 108 (90 Base) MCG/ACT inhaler USE 2 INHALATIONS BY MOUTH EVERY 6 HOURS AS NEEDED FOR WHEEZING  OR SHORTNESS OF BREATH 01/08/23   Philip Aspen, Limmie Patricia, MD  atorvastatin (LIPITOR) 10 MG tablet TAKE 1 TABLET BY MOUTH DAILY 10/05/22   Philip Aspen, Limmie Patricia, MD  Barberry-Oreg Grape-Goldenseal (BERBERINE COMPLEX PO) Take by mouth.    [provider]  Bioflavonoid Products (ESTER-C) TABS Take by mouth daily.    [provider]  busPIRone (BUSPAR) 15 MG tablet TAKE 1 TABLET BY MOUTH TWICE  DAILY 10/16/22   Philip Aspen, Limmie Patricia, MD  cyanocobalamin 1000 MCG tablet Take 2,000 mcg by mouth daily.    [provider]  losartan (COZAAR) 100 MG tablet TAKE 1 TABLET BY MOUTH DAILY 10/05/22   Philip Aspen, Limmie Patricia, MD  magnesium  30 MG tablet Take 30 mg by mouth 2 (two) times daily.    [provider]  meclizine (ANTIVERT) 12.5 MG tablet Take 1 tablet (12.5 mg total) by mouth 3 (three) times daily as needed for dizziness. 04/05/23   Linwood Dibbles, MD  montelukast (SINGULAIR) 10 MG tablet TAKE 1 TABLET BY MOUTH AT  BEDTIME 10/05/22   Philip Aspen, Limmie Patricia, MD  ondansetron (ZOFRAN-ODT) 8 MG disintegrating tablet Take 1 tablet (8 mg total) by mouth every 8 (eight) hours as needed for nausea or vomiting. 04/05/23   Linwood Dibbles, MD  sertraline (ZOLOFT) 50 MG tablet TAKE 1 TABLET BY MOUTH DAILY 01/25/23   Philip Aspen, Limmie Patricia, MD  SYMBICORT 160-4.5 MCG/ACT inhaler USE 2 INHALATIONS BY MOUTH TWICE DAILY 09/20/22   Philip Aspen, Limmie Patricia, MD      Allergies    Morphine and codeine and Sulfa antibiotics    Review of Systems   Review of Systems  Physical Exam Updated Vital Signs BP (!) 151/92 (BP Location: Left Arm)   Pulse 87   Temp 98.3 F (36.8 C)   Resp 18   SpO2 93%  Physical Exam Vitals and nursing note reviewed.  Constitutional:      General: She is not in acute distress.    Appearance: She is well-developed. She is not ill-appearing.  HENT:     Head: Normocephalic and atraumatic.     Right Ear:  Tympanic membrane normal.     Left Ear: Tympanic membrane normal.     Nose: Nose normal.     Mouth/Throat:     Mouth: Mucous membranes are moist.  Eyes:     Extraocular Movements: Extraocular movements intact.     Conjunctiva/sclera: Conjunctivae normal.     Pupils: Pupils are equal, round, and reactive to light.  Cardiovascular:     Rate and Rhythm: Normal rate and regular rhythm.     Pulses: Normal pulses.     Heart sounds: Normal heart sounds. No murmur heard. Pulmonary:     Effort: Pulmonary effort is normal. No respiratory distress.     Breath sounds: Normal breath sounds.  Abdominal:     Palpations: Abdomen is soft.     Tenderness: There is no abdominal tenderness.  Musculoskeletal:         General: No swelling.     Cervical back: Normal range of motion and neck supple.  Skin:    General: Skin is warm and dry.     Capillary Refill: Capillary refill takes less than 2 seconds.  Neurological:     General: No focal deficit present.     Mental Status: She is alert and oriented to person, place, and time.     Cranial Nerves: No cranial nerve deficit.     Sensory: No sensory deficit.     Motor: No weakness.     Coordination: Coordination normal.     Comments: 5+ out of 5 strength throughout, normal sensation, no nystagmus, no drift, normal finger-nose-finger, normal speech  Psychiatric:        Mood and Affect: Mood normal.     ED Results / Procedures / Treatments   Labs (all labs ordered are listed, but only abnormal results are displayed) Labs Reviewed - No data to display  EKG EKG Interpretation Date/Time:  Friday April 06 2023 11:35:30 EDT Ventricular Rate:  87 PR Interval:  172 QRS Duration:  80 QT Interval:  350 QTC Calculation: 421 R Axis:   35  Text Interpretation: Normal sinus rhythm When compared with ECG of 05-Apr-2023 14:52, PREVIOUS ECG IS PRESENT Confirmed by Virgina Norfolk [phone]) on 04/06/2023 11:37:28 AM  Radiology CT Head Wo Contrast  Result Date: 04/06/2023 CLINICAL DATA:  Headache EXAM: CT HEAD WITHOUT CONTRAST TECHNIQUE: Contiguous axial images were obtained from the base of the skull through the vertex without intravenous contrast. RADIATION DOSE REDUCTION: This exam was performed according to the departmental dose-optimization program which includes automated exposure control, adjustment of the mA and/or kV according to patient size and/or use of iterative reconstruction technique. COMPARISON:  None Available. FINDINGS: Brain: No evidence of acute infarction, hemorrhage, hydrocephalus, extra-axial collection or mass lesion/mass effect. Vascular: No hyperdense vessel or unexpected calcification. Skull: Normal. Negative for fracture or focal  lesion. Sinuses/Orbits: No acute finding. Other: None. IMPRESSION: No specific etiology for headaches identified. Electronically Signed   By: Lorenza Cambridge M.D.   On: 04/06/2023 12:51    Procedures Procedures    Medications Ordered in ED Medications - No data to display  ED Course/ Medical Decision Making/ A&P                                 Medical Decision Making Amount and/or Complexity of Data Reviewed Radiology: ordered.   Tralana Gettelfinger Guerrier is here with dizziness.  Unremarkable vitals.  No fever.  EKG shows sinus rhythm.  No ischemic  changes.  Well-appearing.  Neurological exam is overall unremarkable.  She was seen here yesterday given meclizine and Zofran with good improvement.  She had some other episodes of dizziness when she turns her head to the left this morning.  Was having a hard time again symptom wise.  She had some discussion yesterday about getting an MRI to rule out stroke.  She did not have any head imaging yesterday.  Neurological exam appears to be stable right now.  CT scan was done which was normal.  No head bleed or stroke per radiology report.  Overall, we do have MRI here today but there is a long wait and it is possible that we might not be able to get the MRI done here today therefore I recommended that she go to [facility] for MRI to further rule out stroke.  Ultimately patient was feeling better did not want to get an MRI today.  She understands the risks and benefits.  Will have her maybe increase her meclizine.  Educated her about the Epley maneuver.  She understands to return for MRI if she changes her mind.  Patient discharged in good condition.  This chart was dictated using voice recognition software.  Despite best efforts to proofread,  errors can occur which can change the documentation meaning.         Final Clinical Impression(s) / ED Diagnoses Final diagnoses:  Dizziness    Rx / DC Orders ED Discharge Orders     None         Virgina Norfolk, DO 04/06/23 1408

## 2023-04-06 NOTE — ED Triage Notes (Addendum)
Pt presents to ED Pov. Pt c/o dizziness. Pt reports that she was seen here yesterday, told to come back and get MRI if dizziness had not resolved. Pt reports dizziness is different today. Yesterday it was the room spinning, now it's when she moves her head. Pt also c/o bilateral temporal HA

## 2023-04-06 NOTE — Discharge Instructions (Signed)
You can take 25 mg of meclizine every 8 hours as needed.  But consider starting with 12.5 mg 3 times a day first.  Take Zofran as needed for nausea and vomiting.  Please return for MRI if you change your mind as discussed.

## 2023-04-12 ENCOUNTER — Telehealth: Payer: Self-pay | Admitting: *Deleted

## 2023-04-12 NOTE — Telephone Encounter (Signed)
Transition Care Management Unsuccessful Follow-up Telephone Call  Date of discharge and from where:  [facility]  04/06/2023  Attempts:  1st Attempt  Reason for unsuccessful TCM follow-up call:  No answer/busy

## 2023-04-13 NOTE — Telephone Encounter (Signed)
Transition Care Management Unsuccessful Follow-up Telephone Call  Date of discharge and from where:  [facility]  04/06/2023  Attempts:  1st Attempt  Reason for unsuccessful TCM follow-up call:  Left voice message

## 2023-04-16 ENCOUNTER — Encounter: Payer: Self-pay | Admitting: Internal Medicine

## 2023-04-16 ENCOUNTER — Ambulatory Visit (INDEPENDENT_AMBULATORY_CARE_PROVIDER_SITE_OTHER): Payer: Medicare Other | Admitting: Internal Medicine

## 2023-04-16 VITALS — BP 130/88 | HR 88 | Temp 97.6°F | Wt 188.4 lb

## 2023-04-16 DIAGNOSIS — Z23 Encounter for immunization: Secondary | ICD-10-CM

## 2023-04-16 DIAGNOSIS — Z09 Encounter for follow-up examination after completed treatment for conditions other than malignant neoplasm: Secondary | ICD-10-CM | POA: Diagnosis not present

## 2023-04-16 DIAGNOSIS — R42 Dizziness and giddiness: Secondary | ICD-10-CM

## 2023-04-16 NOTE — Progress Notes (Signed)
Established Patient Office Visit     CC/Reason for Visit: Vertigo, nausea, vomiting  HPI: Wendy Yates is a 77 y.o. female who is coming in today for the above mentioned reasons.  All of a sudden 10 days ago she experienced the sudden onset of vertigo with profuse nausea and vomiting.  She has had 2 ED visits since then.  The first time she left without any imaging and return to have imaging completed.  Labs were normal, she ultimately had a CT of the head that was normal.  Sometimes symptoms are worse when moving head to the left.   Past Medical/Surgical History: Past Medical History:  Diagnosis Date   Allergic rhinitis, cause unspecified    Anxiety    Asthma, extrinsic    Depression    Hyperlipidemia    Hypertension    Osteopenia    DEXA 02/2012: -2.0; 12/19/07: -2.1; 08/17/04: -1.6   Post-polio syndrome    polio age 58,  RLE most affected   Scoliosis    1979 harrington rod surgery   Urine incontinence     Past Surgical History:  Procedure Laterality Date   BACK SURGERY  1979   harrington rod inserted   BREAST BIOPSY     CHOLECYSTECTOMY  1969   Left hammertoe repair     Multiple surgeries to allow her to walk post polio     TONSILLECTOMY  1952   TUBAL LIGATION      Social History:  reports that she quit smoking about 13 years ago. Her smoking use included cigarettes. She started smoking about 23 years ago. She has a 5 pack-year smoking history. She has never used smokeless tobacco. She reports current alcohol use. She reports that she does not use drugs.  Allergies: Allergies  Allergen Reactions   Morphine And Codeine Other (See Comments)    "crazy"   Sulfa Antibiotics Swelling and Other (See Comments)    "swollen joints and extreme pain"    Family History:  Family History  Problem Relation Age of Onset   Alcohol abuse Mother    Arthritis Mother    Arthritis Father    Colon cancer Neg Hx      Current Outpatient Medications:    albuterol  (PROVENTIL) (2.5 MG/3ML) 0.083% nebulizer solution, Take 3 mLs (2.5 mg total) by nebulization 4 (four) times daily as needed., Disp: 75 mL, Rfl: 3   albuterol (VENTOLIN HFA) 108 (90 Base) MCG/ACT inhaler, USE 2 INHALATIONS BY MOUTH EVERY 6 HOURS AS NEEDED FOR WHEEZING  OR SHORTNESS OF BREATH, Disp: 34 g, Rfl: 2   atorvastatin (LIPITOR) 10 MG tablet, TAKE 1 TABLET BY MOUTH DAILY, Disp: 100 tablet, Rfl: 2   Barberry-Oreg Grape-Goldenseal (BERBERINE COMPLEX PO), Take by mouth., Disp: , Rfl:    Bioflavonoid Products (ESTER-C) TABS, Take by mouth daily., Disp: , Rfl:    busPIRone (BUSPAR) 15 MG tablet, TAKE 1 TABLET BY MOUTH TWICE  DAILY, Disp: 200 tablet, Rfl: 0   cyanocobalamin 1000 MCG tablet, Take 2,000 mcg by mouth daily., Disp: , Rfl:    losartan (COZAAR) 100 MG tablet, TAKE 1 TABLET BY MOUTH DAILY, Disp: 100 tablet, Rfl: 2   magnesium 30 MG tablet, Take 30 mg by mouth 2 (two) times daily., Disp: , Rfl:    meclizine (ANTIVERT) 12.5 MG tablet, Take 1 tablet (12.5 mg total) by mouth 3 (three) times daily as needed for dizziness., Disp: 30 tablet, Rfl: 0   montelukast (SINGULAIR) 10 MG tablet, TAKE  1 TABLET BY MOUTH AT  BEDTIME, Disp: 100 tablet, Rfl: 2   ondansetron (ZOFRAN-ODT) 8 MG disintegrating tablet, Take 1 tablet (8 mg total) by mouth every 8 (eight) hours as needed for nausea or vomiting., Disp: 12 tablet, Rfl: 0   sertraline (ZOLOFT) 50 MG tablet, TAKE 1 TABLET BY MOUTH DAILY, Disp: 100 tablet, Rfl: 1   SYMBICORT 160-4.5 MCG/ACT inhaler, USE 2 INHALATIONS BY MOUTH TWICE DAILY, Disp: 30.6 g, Rfl: 3  Review of Systems:  Negative unless indicated in HPI.   Physical Exam: Vitals:   04/16/23 1035  BP: 130/88  Pulse: 88  Temp: 97.6 F (36.4 C)  TempSrc: Oral  SpO2: 96%  Weight: 188 lb 6.4 oz (85.5 kg)    Body mass index is 37.42 kg/m.   Physical Exam Vitals reviewed.  Constitutional:      Appearance: Normal appearance.  HENT:     Head: Normocephalic and atraumatic.  Eyes:      Conjunctiva/sclera: Conjunctivae normal.     Pupils: Pupils are equal, round, and reactive to light.  Cardiovascular:     Rate and Rhythm: Normal rate and regular rhythm.  Pulmonary:     Effort: Pulmonary effort is normal.     Breath sounds: Normal breath sounds.  Skin:    General: Skin is warm and dry.  Neurological:     General: No focal deficit present.     Mental Status: She is alert and oriented to person, place, and time.  Psychiatric:        Mood and Affect: Mood normal.        Behavior: Behavior normal.        Thought Content: Thought content normal.        Judgment: Judgment normal.      Impression and Plan:  Hospital discharge follow-up  Vertigo -     Referral to Neuro Rehab   [facility] charts reviewed in detail including labs and result of imaging studies. -I suspect she has benign paroxysmal positional vertigo and I have recommended referral to vestibular therapy.  Can continue to use as needed Zofran and meclizine.  Time spent:31 minutes reviewing chart, interviewing and examining patient and formulating plan of care.     Chaya Jan, MD [facility] at [facility]

## 2023-04-27 NOTE — Therapy (Signed)
OUTPATIENT PHYSICAL THERAPY VESTIBULAR EVALUATION     Patient Name: Wendy Yates MRN: [id number] DOB:[birth year], 77 y.o., female Today's Date: 04/30/2023  END OF SESSION:  PT End of Session - 04/30/23 1213     Visit Number 1    Number of Visits 9    Date for PT Re-Evaluation 05/28/23    Authorization Type [employer] Medicare    Authorization Time Period auth submitted    PT Start Time 1022    PT Stop Time 1108    PT Time Calculation (min) 46 min    Equipment Utilized During Treatment Gait belt    Activity Tolerance Other (comment)   limited by dizziness   Behavior During Therapy WFL for tasks assessed/performed             Past Medical History:  Diagnosis Date   Allergic rhinitis, cause unspecified    Anxiety    Asthma, extrinsic    Depression    Hyperlipidemia    Hypertension    Osteopenia    DEXA 02/2012: -2.0; 12/19/07: -2.1; 08/17/04: -1.6   Post-polio syndrome    polio age 63,  RLE most affected   Scoliosis    1979 harrington rod surgery   Urine incontinence    Past Surgical History:  Procedure Laterality Date   BACK SURGERY  1979   harrington rod inserted   BREAST BIOPSY     CHOLECYSTECTOMY  1969   Left hammertoe repair     Multiple surgeries to allow her to walk post polio     TONSILLECTOMY  1952   TUBAL LIGATION     Patient Active Problem List   Diagnosis Date Noted   Osteoporosis 01/18/2022   IGT (impaired glucose tolerance) 06/29/2021   Tear of left glenoid labrum 12/26/2017   Routine general medical examination at a health care facility 04/20/2015   Hyperlipidemia 11/28/2011   Essential hypertension    Asthma, extrinsic    Osteopenia    Post-polio syndrome    Depression    Allergic rhinitis, cause unspecified    Scoliosis     PCP: Philip Aspen, Limmie Patricia, MD  REFERRING PROVIDER: Philip Aspen, Limmie Patricia, MD   REFERRING DIAG:  R42 (ICD-10-CM) - Vertigo  THERAPY DIAG:  BPPV (benign paroxysmal positional vertigo),  left  Dizziness and giddiness  Unsteadiness on feet  ONSET DATE: 04/05/23  Rationale for Evaluation and Treatment: Rehabilitation  SUBJECTIVE:   SUBJECTIVE STATEMENT: Patient reports that she is weak from the polio. Hasn't had too much dizziness since getting back from the hospital but had it yesterday and today. Reports that she always has allergies this time of year and is congested currently. Dizziness lasts minutes and is described as lightheaded and spinning. Worse with rushing/moving quickly, stress, standing up. Denies head trauma, infection/illness, hearing loss, tinnitus, otalgia, migraines. Reports some blurred vision recently.  Pt accompanied by: self  PERTINENT HISTORY: Anxiety, asthma, depression, HLD, HTN, post-polio, scoliosis, harrington rod surgery 1979  PAIN:  Are you having pain?  Reports chronic pain from hx polio  PRECAUTIONS: Fall  RED FLAGS: None   WEIGHT BEARING RESTRICTIONS: No  FALLS: Has patient fallen in last 6 months? No  LIVING ENVIRONMENT: Lives with: lives alone Lives in: Other 1 story condo Has following equipment at home: Single point cane, Environmental consultant - 4 wheeled, and Grab bars  PLOF: Independent; reports that she currently feels that she needs help with cleaning house   PATIENT GOALS: improve dizziness   OBJECTIVE:   DIAGNOSTIC FINDINGS:  04/06/23 head CT: No specific etiology for headaches identified.  COGNITION: Overall cognitive status: Within functional limits for tasks assessed   SENSATION: Pt reports intact B UEs/LEs   POSTURE:  Severely flexed/kyphotic posture   GAIT: Gait pattern: Severely flexed/kyphotic posture over 4WW, slowed Assistive device utilized: Walker - 4 wheeled Level of assistance: SBA  PATIENT SURVEYS:  FOTO [phone], 63   VESTIBULAR ASSESSMENT:  GENERAL OBSERVATION: recently wearing reading glasses    OCULOMOTOR EXAM:  Ocular Alignment: normal  Ocular ROM:  L eye limited inferior ROM  Spontaneous  Nystagmus: absent  Gaze-Induced Nystagmus: absent  Smooth Pursuits: saccades vertically 2-3  Saccades: intact  Convergence/Divergence: 3 inches    VESTIBULAR - OCULAR REFLEX:   Slow VOR: Normal  VOR Cancellation: Comment: limited by coordination; c/o double vision   Head-Impulse Test: HIT Right: unable to successfully test d/t guarding  HIT Left: unable to successfully test d/t guarding   Alternating pronation/supination: intact B Alternating toe tap: intact B Finger to nose: intact B   POSITIONAL TESTING:  Right Sidelying: negative; saccadic intrusions and c/o dizziness upon laying down and sitting up  Left Sidelying: L upbeating torsional nystagmus lasting ~30 sec; pt with anxiety response    VESTIBULAR TREATMENT:                                                                                                   DATE: 04/30/23   PATIENT EDUCATION: Education details: prognosis, POC, edu on positional vertigo  Person educated: Patient Education method: Explanation, Demonstration, Tactile cues, and Verbal cues Education comprehension: verbalized understanding and returned demonstration  HOME EXERCISE PROGRAM:   GOALS: Goals reviewed with patient? Yes  SHORT TERM GOALS: Target date: 05/14/2023  Patient to be independent with initial HEP. Baseline: HEP initiated Goal status: INITIAL    LONG TERM GOALS: Target date: 05/28/2023  Patient to be independent with advanced HEP. Baseline: Not yet initiated  Goal status: INITIAL  Patient will report 0/10 dizziness with bed mobility.  Baseline: Symptomatic  Goal status: INITIAL  Patient to score at least 46/56 on Berg in order to decrease risk of falls. Baseline: NT Goal status: INITIAL  Patient will ambulate over outdoor surfaces with LRAD while performing head turns to scan environment with good stability in order to indicate safe community mobility. Baseline: Unable Goal status: INITIAL  Patient to score at least 63  on FOTO in order to indicate improved functional outcomes.  Baseline: 54 Goal status: INITIAL    ASSESSMENT:  CLINICAL IMPRESSION:   Patient is a 17 F y/o F presenting to OPPT with c/o dizziness since 04/05/23. Denies head trauma, infection/illness, hearing loss, tinnitus, otalgia, migraines. Reports some blurred vision recently. Patient today presenting with flexed posture, gait deviations and imbalance, limited L orbit inferior ROM, convergence insufficiency, saccades with vertical smooth pursuit, and + L sidelying test. Did not treat BPPV today d/t pt's anxiety response. She reported not feeling comfortable ambulating out of session d/t dizziness, thus was safely escorted out to car with neighbor in w/c. Would benefit from skilled PT services 1-2x/week for 4 weeks to  address aforementioned impairments in order to optimize level of function.    OBJECTIVE IMPAIRMENTS: Abnormal gait, decreased activity tolerance, decreased balance, dizziness, and postural dysfunction.   ACTIVITY LIMITATIONS: carrying, lifting, bending, sitting, standing, squatting, sleeping, stairs, transfers, bed mobility, bathing, toileting, dressing, reach over head, hygiene/grooming, and locomotion level  PARTICIPATION LIMITATIONS: meal prep, cleaning, laundry, driving, shopping, community activity, and church  PERSONAL FACTORS: Age, Fitness, Past/current experiences, Time since onset of injury/illness/exacerbation, and 3+ comorbidities: Anxiety, asthma, depression, HLD, HTN, post-polio, scoliosis, harrington rod surgery 1979  are also affecting patient's functional outcome.   REHAB POTENTIAL: Good  CLINICAL DECISION MAKING: Evolving/moderate complexity  EVALUATION COMPLEXITY: Moderate   PLAN:  PT FREQUENCY: 1-2x/week  PT DURATION: 4 weeks  PLANNED INTERVENTIONS: Therapeutic exercises, Therapeutic activity, Neuromuscular re-education, Balance training, Gait training, Patient/Family education, Self Care, Joint  mobilization, Stair training, Vestibular training, Canalith repositioning, Dry Needling, Electrical stimulation, Cryotherapy, Moist heat, Taping, Manual therapy, and Re-evaluation  PLAN FOR NEXT SESSION: treat L BPPV; Gaylyn Lambert, PT, DPT 04/30/23 1:08 PM  [facility] at [facility] [address] [place], [place] [address] Phone # [phone] Fax # [phone]

## 2023-04-30 ENCOUNTER — Other Ambulatory Visit: Payer: Self-pay

## 2023-04-30 ENCOUNTER — Encounter: Payer: Self-pay | Admitting: Physical Therapy

## 2023-04-30 ENCOUNTER — Encounter: Payer: Self-pay | Admitting: Internal Medicine

## 2023-04-30 ENCOUNTER — Ambulatory Visit: Payer: Medicare Other | Attending: Internal Medicine | Admitting: Physical Therapy

## 2023-04-30 DIAGNOSIS — H8112 Benign paroxysmal vertigo, left ear: Secondary | ICD-10-CM | POA: Insufficient documentation

## 2023-04-30 DIAGNOSIS — R2681 Unsteadiness on feet: Secondary | ICD-10-CM | POA: Insufficient documentation

## 2023-04-30 DIAGNOSIS — R42 Dizziness and giddiness: Secondary | ICD-10-CM | POA: Diagnosis not present

## 2023-05-01 MED ORDER — ONDANSETRON 8 MG PO TBDP: 8.0000 mg | ORAL_TABLET | Freq: Three times a day (TID) | ORAL | 0 refills | Status: DC | PRN

## 2023-05-01 MED ORDER — MECLIZINE HCL 12.5 MG PO TABS: 12.5000 mg | ORAL_TABLET | Freq: Three times a day (TID) | ORAL | 0 refills | Status: DC | PRN

## 2023-05-01 NOTE — Telephone Encounter (Signed)
Pt called to say her prescriptions were accidentally sent to the wrong pharmacy. Pt states the following should have been sent to: [id number] [place], [place] - [address] AT [facility] [address] [facility] [address] Phone: [phone]  Fax: [phone]     meclizine (ANTIVERT) 12.5 MG tablet ondansetron (ZOFRAN-ODT) 8 MG disintegrating tablet

## 2023-05-01 NOTE — Addendum Note (Signed)
Addended by: Kern Reap B on: 05/01/2023 10:27 AM   Modules accepted: Orders

## 2023-05-03 ENCOUNTER — Ambulatory Visit: Payer: Medicare Other

## 2023-05-03 DIAGNOSIS — R2681 Unsteadiness on feet: Secondary | ICD-10-CM

## 2023-05-03 DIAGNOSIS — H8112 Benign paroxysmal vertigo, left ear: Secondary | ICD-10-CM

## 2023-05-03 DIAGNOSIS — R42 Dizziness and giddiness: Secondary | ICD-10-CM | POA: Diagnosis not present

## 2023-05-03 NOTE — Therapy (Signed)
OUTPATIENT PHYSICAL THERAPY VESTIBULAR TREATMENT     Patient Name: Wendy Yates MRN: [id number] DOB:[birth year], 77 y.o., female Today's Date: 05/03/2023  END OF SESSION:  PT End of Session - 05/03/23 1612     Visit Number 2    Number of Visits 9    Date for PT Re-Evaluation 05/28/23    Authorization Type [employer] Medicare    Authorization Time Period auth submitted    PT Start Time 1615    PT Stop Time 1700    PT Time Calculation (min) 45 min    Equipment Utilized During Treatment Gait belt    Activity Tolerance Other (comment)   limited by dizziness   Behavior During Therapy WFL for tasks assessed/performed             Past Medical History:  Diagnosis Date   Allergic rhinitis, cause unspecified    Anxiety    Asthma, extrinsic    Depression    Hyperlipidemia    Hypertension    Osteopenia    DEXA 02/2012: -2.0; 12/19/07: -2.1; 08/17/04: -1.6   Post-polio syndrome    polio age 58,  RLE most affected   Scoliosis    1979 harrington rod surgery   Urine incontinence    Past Surgical History:  Procedure Laterality Date   BACK SURGERY  1979   harrington rod inserted   BREAST BIOPSY     CHOLECYSTECTOMY  1969   Left hammertoe repair     Multiple surgeries to allow her to walk post polio     TONSILLECTOMY  1952   TUBAL LIGATION     Patient Active Problem List   Diagnosis Date Noted   Osteoporosis 01/18/2022   IGT (impaired glucose tolerance) 06/29/2021   Tear of left glenoid labrum 12/26/2017   Routine general medical examination at a health care facility 04/20/2015   Hyperlipidemia 11/28/2011   Essential hypertension    Asthma, extrinsic    Osteopenia    Post-polio syndrome    Depression    Allergic rhinitis, cause unspecified    Scoliosis     PCP: Philip Aspen, Limmie Patricia, MD  REFERRING PROVIDER: Philip Aspen, Limmie Patricia, MD   REFERRING DIAG:  R42 (ICD-10-CM) - Vertigo  THERAPY DIAG:  BPPV (benign paroxysmal positional vertigo),  left  Dizziness and giddiness  Unsteadiness on feet  ONSET DATE: 04/05/23  Rationale for Evaluation and Treatment: Rehabilitation  SUBJECTIVE:   SUBJECTIVE STATEMENT: Nervous about the symptoms being triggered   Pt accompanied by: self  PERTINENT HISTORY: Anxiety, asthma, depression, HLD, HTN, post-polio, scoliosis, harrington rod surgery 1979  PAIN:  Are you having pain?  Reports chronic pain from hx polio  PRECAUTIONS: Fall  RED FLAGS: None   WEIGHT BEARING RESTRICTIONS: No  FALLS: Has patient fallen in last 6 months? No  LIVING ENVIRONMENT: Lives with: lives alone Lives in: Other 1 story condo Has following equipment at home: Single point cane, Environmental consultant - 4 wheeled, and Grab bars  PLOF: Independent; reports that she currently feels that she needs help with cleaning house   PATIENT GOALS: improve dizziness   OBJECTIVE:   TODAY'S TREATMENT: 05/03/23 Activity Comments  Education regarding repositioning maneuver   Left Dix-Hallpike Large amplitude left upbeating nystagmus x 10-15 sec  Left Epley Maneuver -experienced 2nd bout of left upbeating nystagmus after 45 sec in position 1  Left Dix-Hallpike -no nystagmus/dizziness  Upon arising right sidelying to sitting experienced left upbeating   Left sidelying -no nystagmus/symptoms with sidelying position, dizziness and subtle  left upbeat nystagmus with arising  Right sidelying  Uneventful, no issues with position or arising        DIAGNOSTIC FINDINGS: 04/06/23 head CT: No specific etiology for headaches identified.  COGNITION: Overall cognitive status: Within functional limits for tasks assessed   SENSATION: Pt reports intact B UEs/LEs   POSTURE:  Severely flexed/kyphotic posture   GAIT: Gait pattern: Severely flexed/kyphotic posture over 4WW, slowed Assistive device utilized: Walker - 4 wheeled Level of assistance: SBA  PATIENT SURVEYS:  FOTO [phone], 63   VESTIBULAR ASSESSMENT:  GENERAL OBSERVATION:  recently wearing reading glasses    OCULOMOTOR EXAM:  Ocular Alignment: normal  Ocular ROM:  L eye limited inferior ROM  Spontaneous Nystagmus: absent  Gaze-Induced Nystagmus: absent  Smooth Pursuits: saccades vertically 2-3  Saccades: intact  Convergence/Divergence: 3 inches    VESTIBULAR - OCULAR REFLEX:   Slow VOR: Normal  VOR Cancellation: Comment: limited by coordination; c/o double vision   Head-Impulse Test: HIT Right: unable to successfully test d/t guarding  HIT Left: unable to successfully test d/t guarding   Alternating pronation/supination: intact B Alternating toe tap: intact B Finger to nose: intact B   POSITIONAL TESTING:  Right Sidelying: negative; saccadic intrusions and c/o dizziness upon laying down and sitting up  Left Sidelying: L upbeating torsional nystagmus lasting ~30 sec; pt with anxiety response      PATIENT EDUCATION: Education details: prognosis, POC, edu on positional vertigo  Person educated: Patient Education method: Explanation, Demonstration, Tactile cues, and Verbal cues Education comprehension: verbalized understanding and returned demonstration  HOME EXERCISE PROGRAM:   GOALS: Goals reviewed with patient? Yes  SHORT TERM GOALS: Target date: 05/14/2023  Patient to be independent with initial HEP. Baseline: HEP initiated Goal status: INITIAL    LONG TERM GOALS: Target date: 05/28/2023  Patient to be independent with advanced HEP. Baseline: Not yet initiated  Goal status: INITIAL  Patient will report 0/10 dizziness with bed mobility.  Baseline: Symptomatic  Goal status: INITIAL  Patient to score at least 46/56 on Berg in order to decrease risk of falls. Baseline: NT Goal status: INITIAL  Patient will ambulate over outdoor surfaces with LRAD while performing head turns to scan environment with good stability in order to indicate safe community mobility. Baseline: Unable Goal status: INITIAL  Patient to score at least  63 on FOTO in order to indicate improved functional outcomes.  Baseline: 54 Goal status: INITIAL    ASSESSMENT:  CLINICAL IMPRESSION: Provided demonstration/explanation of canalith repositioning and able to tolerate positioning with 2 pillows at mid-back vs head off EOB due to chronic back pain/derangement. Demonstrates left upbeating nystagmus with left Dix-Hallpike and able to proceed with Epley maneuver albeit with some unusual presentations as a 2nd bout of vertigo/nystagmus was experienced after initial bout in position 1 and continued slight left upbeating nystagmus when returned to upright sitting but with marked decreased intensity.  Pt educated on etiology via description and infographics, reports understanding.  Completed 2nd rep of Epley maneuver and relevant follow-up education provided.  Continued sessions indicated to progress POC details and eliminate positional dizziness.  OBJECTIVE IMPAIRMENTS: Abnormal gait, decreased activity tolerance, decreased balance, dizziness, and postural dysfunction.   ACTIVITY LIMITATIONS: carrying, lifting, bending, sitting, standing, squatting, sleeping, stairs, transfers, bed mobility, bathing, toileting, dressing, reach over head, hygiene/grooming, and locomotion level  PARTICIPATION LIMITATIONS: meal prep, cleaning, laundry, driving, shopping, community activity, and church  PERSONAL FACTORS: Age, Fitness, Past/current experiences, Time since onset of injury/illness/exacerbation, and 3+ comorbidities: Anxiety, asthma,  depression, HLD, HTN, post-polio, scoliosis, harrington rod surgery 1979  are also affecting patient's functional outcome.   REHAB POTENTIAL: Good  CLINICAL DECISION MAKING: Evolving/moderate complexity  EVALUATION COMPLEXITY: Moderate   PLAN:  PT FREQUENCY: 1-2x/week  PT DURATION: 4 weeks  PLANNED INTERVENTIONS: Therapeutic exercises, Therapeutic activity, Neuromuscular re-education, Balance training, Gait training,  Patient/Family education, Self Care, Joint mobilization, Stair training, Vestibular training, Canalith repositioning, Dry Needling, Electrical stimulation, Cryotherapy, Moist heat, Taping, Manual therapy, and Re-evaluation  PLAN FOR NEXT SESSION: treat L BPPV; Berg   7:49 AM, 05/04/23 M. Shary Decamp, PT, DPT Physical Therapist- [place] Office Number: [phone]

## 2023-05-04 NOTE — Therapy (Signed)
OUTPATIENT PHYSICAL THERAPY VESTIBULAR TREATMENT     Patient Name: Wendy Yates MRN: [id number] DOB:[birth year], 77 y.o., female Today's Date: 05/07/2023  END OF SESSION:  PT End of Session - 05/07/23 1101     Visit Number 3    Number of Visits 9    Date for PT Re-Evaluation 05/28/23    Authorization Type [employer] Medicare    Authorization Time Period approved 8 PT visits from 04/30/2023 - 05/28/2023    PT Start Time 1010    PT Stop Time 1059    PT Time Calculation (min) 49 min    Equipment Utilized During Treatment Gait belt    Activity Tolerance Patient tolerated treatment well;Other (comment)   limited by nausea   Behavior During Therapy [facility] for tasks assessed/performed              Past Medical History:  Diagnosis Date   Allergic rhinitis, cause unspecified    Anxiety    Asthma, extrinsic    Depression    Hyperlipidemia    Hypertension    Osteopenia    DEXA 02/2012: -2.0; 12/19/07: -2.1; 08/17/04: -1.6   Post-polio syndrome    polio age 67,  RLE most affected   Scoliosis    1979 harrington rod surgery   Urine incontinence    Past Surgical History:  Procedure Laterality Date   BACK SURGERY  1979   harrington rod inserted   BREAST BIOPSY     CHOLECYSTECTOMY  1969   Left hammertoe repair     Multiple surgeries to allow her to walk post polio     TONSILLECTOMY  1952   TUBAL LIGATION     Patient Active Problem List   Diagnosis Date Noted   Osteoporosis 01/18/2022   IGT (impaired glucose tolerance) 06/29/2021   Tear of left glenoid labrum 12/26/2017   Routine general medical examination at a health care facility 04/20/2015   Hyperlipidemia 11/28/2011   Essential hypertension    Asthma, extrinsic    Osteopenia    Post-polio syndrome    Depression    Allergic rhinitis, cause unspecified    Scoliosis     PCP: Philip Aspen, Limmie Patricia, MD  REFERRING PROVIDER: Philip Aspen, Limmie Patricia, MD   REFERRING DIAG:  R42 (ICD-10-CM) - Vertigo  THERAPY  DIAG:  BPPV (benign paroxysmal positional vertigo), left  Dizziness and giddiness  Unsteadiness on feet  ONSET DATE: 04/05/23  Rationale for Evaluation and Treatment: Rehabilitation  SUBJECTIVE:   SUBJECTIVE STATEMENT: Woke up this morning ready to take on the world. Reports that she felt a little off on Friday and Saturday.   Pt accompanied by: self  PERTINENT HISTORY: Anxiety, asthma, depression, HLD, HTN, post-polio, scoliosis, harrington rod surgery 1979  PAIN:  Are you having pain? Yes: NPRS scale: 7.5/10 Pain location: back Pain description: ache Aggravating factors: weather Relieving factors: -  PRECAUTIONS: Fall  RED FLAGS: None   WEIGHT BEARING RESTRICTIONS: No  FALLS: Has patient fallen in last 6 months? No  LIVING ENVIRONMENT: Lives with: lives alone Lives in: Other 1 story condo Has following equipment at home: Single point cane, Environmental consultant - 4 wheeled, and Grab bars  PLOF: Independent; reports that she currently feels that she needs help with cleaning house   PATIENT GOALS: improve dizziness   OBJECTIVE:     TODAY'S TREATMENT: 05/07/23 Activity Comments  Berg 19/56  L DH Latent L upbeating torsional nystagmus lasting ~20 sec; less intense than previous sessions  L epley  Tolerated well  with black bolster under knees and 2 pillows under midback  L DH x2 Negative; however upon sitting up patient has reversal of nystagmus   Sitting anterior canal habituation with 4WW in front 2x each side; c/o dizziness and nausea to L side  L DH Negative; however upon sitting up patient has reversal of nystagmus      HOME EXERCISE PROGRAM Last updated: 05/07/23 Access Code: [email or web address] URL: [email or web address] Date: 05/07/2023 Prepared by: [facility] - Outpatient  Rehab - [facility]  Exercises - Brandt-Daroff Vestibular Exercise  - 1 x daily - 5 x weekly - 2 sets - 3 reps - Seated to Fold Over Vestibular Habituation  - 1 x daily - 5 x weekly -  2 sets - 3 reps - 10 sec hold    PATIENT EDUCATION: Education details: edu on HEP to be performed with daughter's assist  Person educated: Patient Education method: Explanation, Demonstration, Tactile cues, Verbal cues, and Handouts Education comprehension: verbalized understanding and returned demonstration   Below measures were taken at time of initial evaluation unless otherwise specified:  DIAGNOSTIC FINDINGS: 04/06/23 head CT: No specific etiology for headaches identified.  COGNITION: Overall cognitive status: Within functional limits for tasks assessed   SENSATION: Pt reports intact B UEs/LEs   POSTURE:  Severely flexed/kyphotic posture   GAIT: Gait pattern: Severely flexed/kyphotic posture over 4WW, slowed Assistive device utilized: Walker - 4 wheeled Level of assistance: SBA  PATIENT SURVEYS:  FOTO [phone], 63   VESTIBULAR ASSESSMENT:  GENERAL OBSERVATION: recently wearing reading glasses    OCULOMOTOR EXAM:  Ocular Alignment: normal  Ocular ROM:  L eye limited inferior ROM  Spontaneous Nystagmus: absent  Gaze-Induced Nystagmus: absent  Smooth Pursuits: saccades vertically 2-3  Saccades: intact  Convergence/Divergence: 3 inches    VESTIBULAR - OCULAR REFLEX:   Slow VOR: Normal  VOR Cancellation: Comment: limited by coordination; c/o double vision   Head-Impulse Test: HIT Right: unable to successfully test d/t guarding  HIT Left: unable to successfully test d/t guarding   Alternating pronation/supination: intact B Alternating toe tap: intact B Finger to nose: intact B   POSITIONAL TESTING:  Right Sidelying: negative; saccadic intrusions and c/o dizziness upon laying down and sitting up  Left Sidelying: L upbeating torsional nystagmus lasting ~30 sec; pt with anxiety response      PATIENT EDUCATION: Education details: prognosis, POC, edu on positional vertigo  Person educated: Patient Education method: Explanation, Demonstration, Tactile cues,  and Verbal cues Education comprehension: verbalized understanding and returned demonstration  HOME EXERCISE PROGRAM:   GOALS: Goals reviewed with patient? Yes  SHORT TERM GOALS: Target date: 05/14/2023  Patient to be independent with initial HEP. Baseline: HEP initiated Goal status: IN PROGRESS    LONG TERM GOALS: Target date: 05/28/2023  Patient to be independent with advanced HEP. Baseline: Not yet initiated  Goal status: IN PROGRESS  Patient will report 0/10 dizziness with bed mobility.  Baseline: Symptomatic  Goal status: IN PROGRESS  Patient to score at least 46/56 on Berg in order to decrease risk of falls. Baseline: 19  Goal status: IN PROGRESS  Patient will ambulate over outdoor surfaces with LRAD while performing head turns to scan environment with good stability in order to indicate safe community mobility. Baseline: Unable Goal status: IN PROGRESS  Patient to score at least 63 on FOTO in order to indicate improved functional outcomes.  Baseline: 54 Goal status: IN PROGRESS    ASSESSMENT:  CLINICAL IMPRESSION: Patient arrived to session with report of improved  dizziness since last session. Balance testing revealed significant risk of falls- patient reports this was the case at baseline d/t hx of polio. Positional testing revealed remaining L posterior BPPV but less intense than previous sessions. Tolerated L Epley x1 which resolved BPPV. Worked on habituation activities- patient was quite sensitive to L sided forward bending. Re-check of positional testing was still negative. Nausea settled with water break. Patient reported feeling "squishy" at end of session but otherwise asymptomatic.   OBJECTIVE IMPAIRMENTS: Abnormal gait, decreased activity tolerance, decreased balance, dizziness, and postural dysfunction.   ACTIVITY LIMITATIONS: carrying, lifting, bending, sitting, standing, squatting, sleeping, stairs, transfers, bed mobility, bathing, toileting,  dressing, reach over head, hygiene/grooming, and locomotion level  PARTICIPATION LIMITATIONS: meal prep, cleaning, laundry, driving, shopping, community activity, and church  PERSONAL FACTORS: Age, Fitness, Past/current experiences, Time since onset of injury/illness/exacerbation, and 3+ comorbidities: Anxiety, asthma, depression, HLD, HTN, post-polio, scoliosis, harrington rod surgery 1979  are also affecting patient's functional outcome.   REHAB POTENTIAL: Good  CLINICAL DECISION MAKING: Evolving/moderate complexity  EVALUATION COMPLEXITY: Moderate   PLAN:  PT FREQUENCY: 1-2x/week  PT DURATION: 4 weeks  PLANNED INTERVENTIONS: Therapeutic exercises, Therapeutic activity, Neuromuscular re-education, Balance training, Gait training, Patient/Family education, Self Care, Joint mobilization, Stair training, Vestibular training, Canalith repositioning, Dry Needling, Electrical stimulation, Cryotherapy, Moist heat, Taping, Manual therapy, and Re-evaluation  PLAN FOR NEXT SESSION: treat L BPPV; habituation, balance   Anette Guarneri, PT, DPT 05/07/23 11:04 AM  [facility] at [facility] [address] [place], [place] [address] Phone # [phone] Fax # [phone]

## 2023-05-07 ENCOUNTER — Encounter: Payer: Self-pay | Admitting: Physical Therapy

## 2023-05-07 ENCOUNTER — Ambulatory Visit: Payer: Medicare Other | Admitting: Physical Therapy

## 2023-05-07 DIAGNOSIS — H8112 Benign paroxysmal vertigo, left ear: Secondary | ICD-10-CM

## 2023-05-07 DIAGNOSIS — R2681 Unsteadiness on feet: Secondary | ICD-10-CM | POA: Diagnosis not present

## 2023-05-07 DIAGNOSIS — R42 Dizziness and giddiness: Secondary | ICD-10-CM | POA: Diagnosis not present

## 2023-05-24 ENCOUNTER — Ambulatory Visit: Payer: Medicare Other

## 2023-06-08 ENCOUNTER — Other Ambulatory Visit: Payer: Self-pay | Admitting: Internal Medicine

## 2023-07-02 ENCOUNTER — Ambulatory Visit (INDEPENDENT_AMBULATORY_CARE_PROVIDER_SITE_OTHER): Payer: Medicare Other | Admitting: Internal Medicine

## 2023-07-02 ENCOUNTER — Encounter: Payer: Self-pay | Admitting: Internal Medicine

## 2023-07-02 VITALS — BP 161/96 | HR 80 | Temp 97.6°F | Ht 60.0 in | Wt 189.0 lb

## 2023-07-02 DIAGNOSIS — Z1211 Encounter for screening for malignant neoplasm of colon: Secondary | ICD-10-CM | POA: Diagnosis not present

## 2023-07-02 DIAGNOSIS — I10 Essential (primary) hypertension: Secondary | ICD-10-CM

## 2023-07-02 DIAGNOSIS — F325 Major depressive disorder, single episode, in full remission: Secondary | ICD-10-CM

## 2023-07-02 DIAGNOSIS — Z0000 Encounter for general adult medical examination without abnormal findings: Secondary | ICD-10-CM

## 2023-07-02 DIAGNOSIS — M810 Age-related osteoporosis without current pathological fracture: Secondary | ICD-10-CM

## 2023-07-02 DIAGNOSIS — R7302 Impaired glucose tolerance (oral): Secondary | ICD-10-CM

## 2023-07-02 DIAGNOSIS — E782 Mixed hyperlipidemia: Secondary | ICD-10-CM | POA: Diagnosis not present

## 2023-07-02 LAB — CBC WITH DIFFERENTIAL/PLATELET
Basophils Absolute: 0 10*3/uL (ref 0.0–0.1)
Basophils Relative: 0.8 % (ref 0.0–3.0)
Eosinophils Absolute: 0.4 10*3/uL (ref 0.0–0.7)
Eosinophils Relative: 11 % — ABNORMAL HIGH (ref 0.0–5.0)
HCT: 46 % (ref 36.0–46.0)
Hemoglobin: 15.2 g/dL — ABNORMAL HIGH (ref 12.0–15.0)
Lymphocytes Relative: 34.8 % (ref 12.0–46.0)
Lymphs Abs: 1.4 10*3/uL (ref 0.7–4.0)
MCHC: 33 g/dL (ref 30.0–36.0)
MCV: 94.6 fL (ref 78.0–100.0)
Monocytes Absolute: 0.6 10*3/uL (ref 0.1–1.0)
Monocytes Relative: 15.9 % — ABNORMAL HIGH (ref 3.0–12.0)
Neutro Abs: 1.5 10*3/uL (ref 1.4–7.7)
Neutrophils Relative %: 37.5 % — ABNORMAL LOW (ref 43.0–77.0)
Platelets: 285 10*3/uL (ref 150.0–400.0)
RBC: 4.86 Mil/uL (ref 3.87–5.11)
RDW: 13.8 % (ref 11.5–15.5)
WBC: 4 10*3/uL (ref 4.0–10.5)

## 2023-07-02 LAB — COMPREHENSIVE METABOLIC PANEL
ALT: 11 U/L (ref 0–35)
AST: 24 U/L (ref 0–37)
Albumin: 4.3 g/dL (ref 3.5–5.2)
Alkaline Phosphatase: 88 U/L (ref 39–117)
BUN: 13 mg/dL (ref 6–23)
CO2: 30 meq/L (ref 19–32)
Calcium: 9.3 mg/dL (ref 8.4–10.5)
Chloride: 97 meq/L (ref 96–112)
Creatinine, Ser: 0.56 mg/dL (ref 0.40–1.20)
GFR: 87.68 mL/min (ref >60.00)
Glucose, Bld: 95 mg/dL (ref 70–99)
Potassium: 3.9 meq/L (ref 3.5–5.1)
Sodium: 136 meq/L (ref 135–145)
Total Bilirubin: 1.1 mg/dL (ref 0.2–1.2)
Total Protein: 7.3 g/dL (ref 6.0–8.3)

## 2023-07-02 LAB — LIPID PANEL
Cholesterol: 144 mg/dL (ref 0–200)
HDL: 49.5 mg/dL (ref >39.00)
LDL Cholesterol: 72 mg/dL (ref 0–99)
NonHDL: 94.27
Total CHOL/HDL Ratio: 3
Triglycerides: 111 mg/dL (ref 0.0–149.0)
VLDL: 22.2 mg/dL (ref 0.0–40.0)

## 2023-07-02 LAB — VITAMIN B12: Vitamin B-12: 1009 pg/mL — ABNORMAL HIGH (ref 211–911)

## 2023-07-02 LAB — TSH: TSH: 1.6 u[IU]/mL (ref 0.35–5.50)

## 2023-07-02 LAB — HEMOGLOBIN A1C: Hgb A1c MFr Bld: 6.1 % (ref 4.6–6.5)

## 2023-07-02 LAB — VITAMIN D 25 HYDROXY (VIT D DEFICIENCY, FRACTURES): VITD: 53.28 ng/mL (ref 30.00–100.00)

## 2023-07-02 NOTE — Progress Notes (Signed)
Established Patient Office Visit     CC/Reason for Visit: Annual preventive exam and subsequent Medicare wellness visit  HPI: Wendy Yates is a 77 y.o. female who is coming in today for the above mentioned reasons. Past Medical History is significant for: Hypertension, hyperlipidemia, impaired glucose tolerance, depression, asthma.  Feeling well.  She has had some issues with vertigo again.  Is considering returning to vestibular therapy.  Has routine eye and dental care.  Is due for RSV and COVID vaccines.  Is due for colon cancer screening she has Cologuard.  She Had a DEXA Scan in 2023.  She Prefers to No Longer Pursue Breast or Cervical Cancer Screening.   Past Medical/Surgical History: Past Medical History:  Diagnosis Date   Allergic rhinitis, cause unspecified    Anxiety    Asthma, extrinsic    Depression    Hyperlipidemia    Hypertension    Osteopenia    DEXA 02/2012: -2.0; 12/19/07: -2.1; 08/17/04: -1.6   Post-polio syndrome    polio age 25,  RLE most affected   Scoliosis    1979 harrington rod surgery   Urine incontinence     Past Surgical History:  Procedure Laterality Date   BACK SURGERY  1979   harrington rod inserted   BREAST BIOPSY     CHOLECYSTECTOMY  1969   Left hammertoe repair     Multiple surgeries to allow her to walk post polio     TONSILLECTOMY  1952   TUBAL LIGATION      Social History:  reports that she quit smoking about 13 years ago. Her smoking use included cigarettes. She started smoking about 23 years ago. She has a 5 pack-year smoking history. She has never used smokeless tobacco. She reports current alcohol use. She reports that she does not use drugs.  Allergies: Allergies  Allergen Reactions   Morphine And Codeine Other (See Comments)    "crazy"   Sulfa Antibiotics Swelling and Other (See Comments)    "swollen joints and extreme pain"    Family History:  Family History  Problem Relation Age of Onset   Alcohol abuse Mother     Arthritis Mother    Arthritis Father    Colon cancer Neg Hx      Current Outpatient Medications:    albuterol (PROVENTIL) (2.5 MG/3ML) 0.083% nebulizer solution, Take 3 mLs (2.5 mg total) by nebulization 4 (four) times daily as needed., Disp: 75 mL, Rfl: 3   albuterol (VENTOLIN HFA) 108 (90 Base) MCG/ACT inhaler, USE 2 INHALATIONS BY MOUTH EVERY 6 HOURS AS NEEDED FOR WHEEZING  OR SHORTNESS OF BREATH, Disp: 34 g, Rfl: 2   atorvastatin (LIPITOR) 10 MG tablet, TAKE 1 TABLET BY MOUTH DAILY, Disp: 100 tablet, Rfl: 2   Barberry-Oreg Grape-Goldenseal (BERBERINE COMPLEX PO), Take by mouth., Disp: , Rfl:    Bioflavonoid Products (ESTER-C) TABS, Take by mouth daily., Disp: , Rfl:    busPIRone (BUSPAR) 15 MG tablet, TAKE 1 TABLET BY MOUTH TWICE  DAILY, Disp: 200 tablet, Rfl: 0   cyanocobalamin 1000 MCG tablet, Take 2,000 mcg by mouth daily., Disp: , Rfl:    losartan (COZAAR) 100 MG tablet, TAKE 1 TABLET BY MOUTH DAILY, Disp: 100 tablet, Rfl: 2   magnesium 30 MG tablet, Take 30 mg by mouth 2 (two) times daily., Disp: , Rfl:    meclizine (ANTIVERT) 12.5 MG tablet, Take 1 tablet (12.5 mg total) by mouth 3 (three) times daily as needed for dizziness., Disp:  30 tablet, Rfl: 0   montelukast (SINGULAIR) 10 MG tablet, TAKE 1 TABLET BY MOUTH AT  BEDTIME, Disp: 100 tablet, Rfl: 2   ondansetron (ZOFRAN-ODT) 8 MG disintegrating tablet, Take 1 tablet (8 mg total) by mouth every 8 (eight) hours as needed for nausea or vomiting., Disp: 12 tablet, Rfl: 0   sertraline (ZOLOFT) 50 MG tablet, TAKE 1 TABLET BY MOUTH DAILY, Disp: 100 tablet, Rfl: 2   SYMBICORT 160-4.5 MCG/ACT inhaler, USE 2 INHALATIONS BY MOUTH TWICE DAILY, Disp: 30.6 g, Rfl: 3  Review of Systems:  Negative unless indicated in HPI.   Physical Exam: Vitals:   07/02/23 0804 07/02/23 0815  BP: (!) 150/90 (!) 161/96  Pulse: 80   Temp: 97.6 F (36.4 C)   TempSrc: Oral   SpO2: 93%   Weight: 189 lb (85.7 kg)   Height: 5' (1.524 m)     Body mass  index is 36.91 kg/m.   Physical Exam Vitals reviewed.  Constitutional:      General: She is not in acute distress.    Appearance: She is not ill-appearing, toxic-appearing or diaphoretic.  HENT:     Head: Normocephalic.     Right Ear: Tympanic membrane, ear canal and external ear normal. There is no impacted cerumen.     Left Ear: Tympanic membrane, ear canal and external ear normal. There is no impacted cerumen.     Nose: Nose normal.     Mouth/Throat:     Mouth: Mucous membranes are moist.     Pharynx: Oropharynx is clear. No oropharyngeal exudate or posterior oropharyngeal erythema.  Eyes:     General: No scleral icterus.       Right eye: No discharge.        Left eye: No discharge.     Conjunctiva/sclera: Conjunctivae normal.     Pupils: Pupils are equal, round, and reactive to light.  Neck:     Vascular: No carotid bruit.  Cardiovascular:     Rate and Rhythm: Normal rate and regular rhythm.     Pulses: Normal pulses.     Heart sounds: Normal heart sounds.  Pulmonary:     Effort: Pulmonary effort is normal. No respiratory distress.     Breath sounds: Normal breath sounds.  Abdominal:     General: Abdomen is flat. Bowel sounds are normal.     Palpations: Abdomen is soft.  Musculoskeletal:        General: Normal range of motion.     Cervical back: Normal range of motion.  Skin:    General: Skin is warm and dry.  Neurological:     General: No focal deficit present.     Mental Status: She is alert and oriented to person, place, and time. Mental status is at baseline.  Psychiatric:        Mood and Affect: Mood normal.        Behavior: Behavior normal.        Thought Content: Thought content normal.        Judgment: Judgment normal.    Subsequent Medicare wellness visit   1. Risk factors, based on past  M,S,F - Cardiac Risk Factors include: advanced age (>62men, >21 women);dyslipidemia   2.  Physical activities: Dietary issues and exercise activities discussed:       3.  Depression/mood:  Flowsheet Row Office Visit from 07/02/2023 in [facility] Total Score 7        4.  ADL's:  07/02/2023    8:03 AM 06/29/2023    8:48 PM  In your present state of health, do you have any difficulty performing the following activities:  Hearing? 1 0  Comment right ear   Vision? 0 0  Difficulty concentrating or making decisions? 0 0  Walking or climbing stairs? 1 1  Comment uses a walker   Dressing or bathing? 0 0  Doing errands, shopping? 0 1  Preparing Food and eating ? N N  Using the Toilet? N N  In the past six months, have you accidently leaked urine? Y Y  Do you have problems with loss of bowel control? N N  Managing your Medications? N N  Managing your Finances? N N  Housekeeping or managing your Housekeeping? N Y     5.  Fall risk:     06/29/2022    8:04 AM 04/16/2023   10:34 AM 06/29/2023    8:48 PM 07/02/2023    8:06 AM 07/02/2023    8:31 AM  Fall Risk  Falls in the past year? 1 1 1 1 1   Was there an injury with Fall? 0 0 0 0 0  Fall Risk Category Calculator 1 1 1 1 1   Fall Risk Category (Retired) Low      (RETIRED) Patient Fall Risk Level Low fall risk      Patient at Risk for Falls Due to Impaired balance/gait      Fall risk Follow up Falls evaluation completed Falls evaluation completed  Falls evaluation completed Falls evaluation completed     6.  Home safety: No problems identified   7.  Height weight, and visual acuity: height and weight as above, vision/hearing: Vision Screening   Right eye Left eye Both eyes  Without correction 20/30 20/30 20/30   With correction        8.  Counseling: Counseling given: Not Answered    9. Lab orders based on risk factors: Laboratory update will be reviewed   10. Cognitive assessment:        07/02/2023    8:07 AM  6CIT Screen  What Year? 0 points  What month? 0 points  What time? 0 points  Count back from 20 0 points  Months in  reverse 0 points  Repeat phrase 0 points  Total Score 0 points     11. Screening: Patient provided with a written and personalized 5-10 year screening schedule in the AVS. Health Maintenance  Topic Date Due   COVID-19 Vaccine (4 - 2023-24 season) 04/22/2023   Medicare Annual Wellness Visit  07/01/2024   DTaP/Tdap/Td vaccine (3 - Td or Tdap) 08/16/2025   Pneumonia Vaccine  Completed   Flu Shot  Completed   DEXA scan (bone density measurement)  Completed   Hepatitis C Screening  Completed   Zoster (Shingles) Vaccine  Completed   HPV Vaccine  Aged Out   Colon Cancer Screening  Discontinued    12. Provider List Update: Patient Care Team    Relationship Specialty Notifications Start End  Philip Aspen, Limmie Patricia, MD PCP - General Internal Medicine  07/23/20      13. Advance Directives: Does Patient Have a Medical Advance Directive?: Yes Type of Advance Directive: Healthcare Power of Attorney, Living will, Out of facility DNR (pink MOST or yellow form) Does patient want to make changes to medical advance directive?: No - Patient declined Copy of Healthcare Power of Attorney in Chart?: No - copy requested  14. Opioids: Patient is not on  any opioid prescriptions and has no risk factors for a substance use disorder.   15.   Goals      Protect My Health     Timeframe:  Long-Range Goal Priority:  Medium Start Date:                             Expected End Date:                       Follow Up Date 07/01/2024    - schedule and keep appointment for annual check-up    Why is this important?   Screening tests can find diseases early when they are easier to treat.  Your doctor or nurse will talk with you about which tests are important for you.  Getting shots for common diseases like the flu and shingles will help prevent them.     Notes:          I have personally reviewed and noted the following in the patient's chart:   Medical and social history Use of alcohol,  tobacco or illicit drugs  Current medications and supplements Functional ability and status Nutritional status Physical activity Advanced directives List of other physicians Hospitalizations, surgeries, and ER visits in previous 12 months Vitals Screenings to include cognitive, depression, and falls Referrals and appointments  In addition, I have reviewed and discussed with patient certain preventive protocols, quality metrics, and best practice recommendations. A written personalized care plan for preventive services as well as general preventive health recommendations were provided to patient.   Impression and Plan:  Medicare annual wellness visit, subsequent  Essential hypertension -     CBC with Differential/Platelet; Future -     Comprehensive metabolic panel; Future -     TSH; Future -     Vitamin B12; Future  IGT (impaired glucose tolerance) -     Hemoglobin A1c; Future  Mixed hyperlipidemia -     Lipid panel; Future  Age-related osteoporosis without current pathological fracture -     VITAMIN D 25 Hydroxy (Vit-D Deficiency, Fractures); Future  Major depressive disorder with single episode, in full remission (HCC)  Screening for colon cancer -     Cologuard  Morbid obesity (HCC)   -Recommend routine eye and dental care. -Healthy lifestyle discussed in detail. -Labs to be updated today. -Prostate cancer screening: N/A Health Maintenance  Topic Date Due   COVID-19 Vaccine (4 - 2023-24 season) 04/22/2023   Medicare Annual Wellness Visit  07/01/2024   DTaP/Tdap/Td vaccine (3 - Td or Tdap) 08/16/2025   Pneumonia Vaccine  Completed   Flu Shot  Completed   DEXA scan (bone density measurement)  Completed   Hepatitis C Screening  Completed   Zoster (Shingles) Vaccine  Completed   HPV Vaccine  Aged Out   Colon Cancer Screening  Discontinued     -She will consider RSV and COVID at pharmacy, other immunizations are up-to-date. -She will monitor her ambulatory  blood pressure measurements and report back if remain elevated. -Cologuard sent. -She elects to no longer continue breast and cervical cancer screening.     Chaya Jan, MD [facility] at [facility]

## 2023-07-30 DIAGNOSIS — Z1211 Encounter for screening for malignant neoplasm of colon: Secondary | ICD-10-CM | POA: Diagnosis not present

## 2023-08-09 LAB — COLOGUARD: COLOGUARD: NEGATIVE

## 2023-09-19 ENCOUNTER — Encounter: Payer: Self-pay | Admitting: Internal Medicine

## 2023-09-24 ENCOUNTER — Other Ambulatory Visit: Payer: Self-pay | Admitting: Internal Medicine

## 2023-09-29 ENCOUNTER — Other Ambulatory Visit: Payer: Self-pay | Admitting: Internal Medicine

## 2024-03-07 ENCOUNTER — Other Ambulatory Visit: Payer: Self-pay | Admitting: Internal Medicine

## 2024-03-20 ENCOUNTER — Emergency Department (HOSPITAL_BASED_OUTPATIENT_CLINIC_OR_DEPARTMENT_OTHER)
Admission: EM | Admit: 2024-03-20 | Discharge: 2024-03-20 | Disposition: A | Discharge status: Home / Self Care | Attending: Emergency Medicine | Admitting: Emergency Medicine

## 2024-03-20 ENCOUNTER — Emergency Department (HOSPITAL_BASED_OUTPATIENT_CLINIC_OR_DEPARTMENT_OTHER): Admitting: Radiology

## 2024-03-20 ENCOUNTER — Other Ambulatory Visit: Payer: Self-pay

## 2024-03-20 ENCOUNTER — Encounter (HOSPITAL_BASED_OUTPATIENT_CLINIC_OR_DEPARTMENT_OTHER): Payer: Self-pay

## 2024-03-20 ENCOUNTER — Other Ambulatory Visit (HOSPITAL_BASED_OUTPATIENT_CLINIC_OR_DEPARTMENT_OTHER): Payer: Self-pay

## 2024-03-20 DIAGNOSIS — M19011 Primary osteoarthritis, right shoulder: Secondary | ICD-10-CM | POA: Diagnosis not present

## 2024-03-20 DIAGNOSIS — I10 Essential (primary) hypertension: Secondary | ICD-10-CM | POA: Diagnosis not present

## 2024-03-20 DIAGNOSIS — Z79899 Other long term (current) drug therapy: Secondary | ICD-10-CM | POA: Insufficient documentation

## 2024-03-20 DIAGNOSIS — M25511 Pain in right shoulder: Secondary | ICD-10-CM | POA: Diagnosis not present

## 2024-03-20 MED ORDER — KETOROLAC TROMETHAMINE 60 MG/2ML IM SOLN
30.0000 mg | Freq: Once | INTRAMUSCULAR | Status: AC
  Administered 2024-03-20: 30 mg via INTRAMUSCULAR
  Filled 2024-03-20: qty 2

## 2024-03-20 MED ORDER — METHYLPREDNISOLONE 4 MG PO TBPK
ORAL_TABLET | ORAL | 0 refills | Status: DC
  Filled 2024-03-20: qty 21, 6d supply, fill #0

## 2024-03-20 NOTE — Discharge Instructions (Addendum)
 Follow-up with the orthopedic doctor.  Take Medrol  Dosepak as prescribed.  Do range of motion exercises but do not do any heavy lifting.  Take Tylenol 1000 mg every 6 hours as needed as well.

## 2024-03-20 NOTE — ED Triage Notes (Signed)
 Pt c/o very excrutiating pain in R shoulder, down R arm. Pt advises she lifts weights, 3lbs & it hurt real bad. States that the muscle in the front is still very sore & it's like my shoulder has locked up- a sharp pain.   Tylenol w relief

## 2024-03-20 NOTE — ED Provider Notes (Signed)
 [place] [facility]ERGENCY DEPARTMENT AT [facility] Provider Note   CSN: [id number] Arrival date & time: 03/20/24  1221     Patient presents with: No chief complaint on file.   Wendy Yates is a 78 y.o. female.   Patient here for right shoulder discomfort for the last couple weeks.  Worse with movement.  She has been doing a lot of weight exercises.  Pain is mostly in the right shoulder.  She denies any chest pain shortness of breath weakness numbness tingling.  No neck pain no headache.  No history of prior injury to this arm.  History of hypertension depression anxiety high cholesterol.  The history is provided by the patient.       Prior to Admission medications   Medication Sig Start Date End Date Taking? Authorizing Provider  methylPREDNISolone  (MEDROL  DOSEPAK) 4 MG TBPK tablet Follow package insert 03/20/24  Yes Adewale Pucillo, DO  albuterol  (PROVENTIL ) (2.5 MG/3ML) 0.083% nebulizer solution Take 3 mLs (2.5 mg total) by nebulization 4 (four) times daily as needed. 12/27/21   Theophilus Andrews, Tully GRADE, MD  albuterol  (VENTOLIN  HFA) 108 [phone] Base) MCG/ACT inhaler USE 2 INHALATIONS BY MOUTH EVERY 6 HOURS AS NEEDED FOR WHEEZING  OR SHORTNESS OF BREATH 09/25/23   Theophilus Andrews, Tully GRADE, MD  atorvastatin  (LIPITOR) 10 MG tablet TAKE 1 TABLET BY MOUTH DAILY 03/10/24   Theophilus Andrews, Tully GRADE, MD  Barberry-Oreg Grape-Goldenseal (BERBERINE COMPLEX PO) Take by mouth.    [provider]  Bioflavonoid Products (ESTER-C) TABS Take by mouth daily.    [provider]  busPIRone  (BUSPAR ) 15 MG tablet TAKE 1 TABLET BY MOUTH TWICE  DAILY 10/01/23   Theophilus Andrews, Tully GRADE, MD  cyanocobalamin  1000 MCG tablet Take 2,000 mcg by mouth daily.    [provider]  losartan  (COZAAR ) 100 MG tablet TAKE 1 TABLET BY MOUTH DAILY 03/10/24   Theophilus Andrews, Tully GRADE, MD  magnesium 30 MG tablet Take 30 mg by mouth 2 (two) times daily.    [provider]  meclizine   (ANTIVERT ) 12.5 MG tablet Take 1 tablet (12.5 mg total) by mouth 3 (three) times daily as needed for dizziness. 05/01/23   Theophilus Andrews, Tully GRADE, MD  montelukast  (SINGULAIR ) 10 MG tablet TAKE 1 TABLET BY MOUTH AT  BEDTIME 03/10/24   Theophilus Andrews, Tully GRADE, MD  ondansetron  (ZOFRAN -ODT) 8 MG disintegrating tablet Take 1 tablet (8 mg total) by mouth every 8 (eight) hours as needed for nausea or vomiting. 05/01/23   Theophilus Andrews, Tully GRADE, MD  sertraline  (ZOLOFT ) 50 MG tablet TAKE 1 TABLET BY MOUTH DAILY 06/11/23   Theophilus Andrews, Tully GRADE, MD  SYMBICORT  160-4.5 MCG/ACT inhaler USE 2 INHALATIONS BY MOUTH TWICE DAILY 09/20/22   Theophilus Andrews, Tully GRADE, MD    Allergies: Morphine and codeine and Sulfa antibiotics    Review of Systems  Updated Vital Signs BP (!) 164/95 (BP Location: Left Arm)   Pulse (!) 105   Temp 97.8 F (36.6 C)   Resp 18   SpO2 94%   Physical Exam Vitals and nursing note reviewed.  Constitutional:      General: She is not in acute distress.    Appearance: She is well-developed.  HENT:     Head: Normocephalic and atraumatic.     Mouth/Throat:     Mouth: Mucous membranes are moist.  Eyes:     Extraocular Movements: Extraocular movements intact.     Conjunctiva/sclera: Conjunctivae normal.  Pupils: Pupils are equal, round, and reactive to light.  Cardiovascular:     Pulses: Normal pulses.  Musculoskeletal:        General: Tenderness present. No swelling.     Cervical back: Neck supple.     Comments: Tenderness at the right shoulder area, right bicipital groove proximally  Skin:    General: Skin is warm and dry.     Capillary Refill: Capillary refill takes less than 2 seconds.  Neurological:     Mental Status: She is alert.     (all labs ordered are listed, but only abnormal results are displayed) Labs Reviewed - No data to display  EKG: None  Radiology: DG Shoulder Right Result Date: 03/20/2024 CLINICAL DATA:  Right shoulder pain.  No  known injury. EXAM: RIGHT SHOULDER - 2+ VIEW COMPARISON:  None Available. FINDINGS: No acute fracture or dislocation. No aggressive osseous lesion. Glenohumeral and acromioclavicular joints are normal in alignment. Moderate osteoarthritis of the acromioclavicular joint. Mild osteoarthritis of the glenohumeral joint. No soft tissue swelling. No radiopaque foreign bodies. IMPRESSION: No acute osseous abnormality of the right shoulder. Electronically Signed   By: Ree Molt M.D.   On: 03/20/2024 13:43     Procedures   Medications Ordered in the ED  ketorolac  (TORADOL ) injection 30 mg (has no administration in time range)                                    Medical Decision Making Amount and/or Complexity of Data Reviewed Radiology: ordered.   [name] is here with right shoulder pain.  She has reproducible pain mostly at the right shoulder, bicipital tendon insertion.  I think this is likely an inflammatory/overuse process.  X-ray of the right shoulder showed no fracture or malalignment.  She is neurovascular neuromuscular intact.  She got good strength and sensation.  I do not think there is any major bicep tendon tear or rupture.  She has no midline spinal pain.  She has no chest pain shortness of breath.  Ultimately patient was given Toradol  shot will put her on steroids and have her follow-up with orthopedics.  Recommend focusing on ice and stretches and range of motion exercises and try not to do any heavy activity until evaluated by orthopedics.  This chart was dictated using voice recognition software.  Despite best efforts to proofread,  errors can occur which can change the documentation meaning.      Final diagnoses:  Acute pain of right shoulder    ED Discharge Orders          Ordered    methylPREDNISolone  (MEDROL  DOSEPAK) 4 MG TBPK tablet        03/20/24 1357               Ruthe Cornet, DO 03/20/24 1358

## 2024-03-24 ENCOUNTER — Other Ambulatory Visit: Payer: Self-pay | Admitting: Internal Medicine

## 2024-04-15 ENCOUNTER — Other Ambulatory Visit: Payer: Self-pay | Admitting: Internal Medicine

## 2024-05-21 DIAGNOSIS — W208XXA Other cause of strike by thrown, projected or falling object, initial encounter: Secondary | ICD-10-CM | POA: Diagnosis not present

## 2024-05-21 DIAGNOSIS — S91109A Unspecified open wound of unspecified toe(s) without damage to nail, initial encounter: Secondary | ICD-10-CM | POA: Diagnosis not present

## 2024-05-21 DIAGNOSIS — S91102A Unspecified open wound of left great toe without damage to nail, initial encounter: Secondary | ICD-10-CM | POA: Diagnosis not present

## 2024-05-21 DIAGNOSIS — Z23 Encounter for immunization: Secondary | ICD-10-CM | POA: Diagnosis not present

## 2024-05-21 DIAGNOSIS — T797XXA Traumatic subcutaneous emphysema, initial encounter: Secondary | ICD-10-CM | POA: Diagnosis not present

## 2024-05-21 DIAGNOSIS — S99922A Unspecified injury of left foot, initial encounter: Secondary | ICD-10-CM | POA: Diagnosis not present

## 2024-05-26 DIAGNOSIS — S91302A Unspecified open wound, left foot, initial encounter: Secondary | ICD-10-CM | POA: Diagnosis not present

## 2024-05-28 ENCOUNTER — Ambulatory Visit (INDEPENDENT_AMBULATORY_CARE_PROVIDER_SITE_OTHER): Admitting: Internal Medicine

## 2024-05-28 ENCOUNTER — Encounter: Payer: Self-pay | Admitting: Internal Medicine

## 2024-05-28 ENCOUNTER — Ambulatory Visit: Admitting: Physician Assistant

## 2024-05-28 VITALS — BP 152/89 | HR 82 | Temp 97.7°F | Wt 170.2 lb

## 2024-05-28 DIAGNOSIS — Z09 Encounter for follow-up examination after completed treatment for conditions other than malignant neoplasm: Secondary | ICD-10-CM | POA: Diagnosis not present

## 2024-05-28 DIAGNOSIS — M869 Osteomyelitis, unspecified: Secondary | ICD-10-CM | POA: Diagnosis not present

## 2024-05-28 DIAGNOSIS — Z23 Encounter for immunization: Secondary | ICD-10-CM

## 2024-05-28 DIAGNOSIS — R7302 Impaired glucose tolerance (oral): Secondary | ICD-10-CM | POA: Diagnosis not present

## 2024-05-28 DIAGNOSIS — S90112A Contusion of left great toe without damage to nail, initial encounter: Secondary | ICD-10-CM

## 2024-05-28 LAB — POCT GLYCOSYLATED HEMOGLOBIN (HGB A1C): Hemoglobin A1C: 5.7 % — AB (ref 4.0–5.6)

## 2024-05-28 NOTE — Addendum Note (Signed)
 Addended by: KATHRYNE MILLMAN B on: 05/28/2024 01:35 PM   Modules accepted: Orders

## 2024-05-28 NOTE — Progress Notes (Signed)
 Established Patient Office Visit     CC/Reason for Visit: Urgent care follow-up  HPI: Wendy Yates is a 78 y.o. female who is coming in today for the above mentioned reasons. Past Medical History is significant for: Hypertension, hyperlipidemia, impaired glucose tolerance, depression, asthma.  2 weeks ago she dropped a glass Pyrex onto her left big toe.  She had significant bruising and development of a blood blister that led her to visiting the emergency department on October 1.  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  She has not yet seen orthopedics.  She is currently taking high-dose Cipro 750 mg.  Requesting flu vaccine   Past Medical/Surgical History: Past Medical History:  Diagnosis Date   Allergic rhinitis, cause unspecified    Anxiety    Asthma, extrinsic    Depression    Hyperlipidemia    Hypertension    Osteopenia    DEXA 02/2012: -2.0; 12/19/07: -2.1; 08/17/04: -1.6   Post-polio syndrome (HCC)    polio age 54,  RLE most affected   Scoliosis    1979 harrington rod surgery   Urine incontinence     Past Surgical History:  Procedure Laterality Date   BACK SURGERY  1979   harrington rod inserted   BREAST BIOPSY     CHOLECYSTECTOMY  1969   Left hammertoe repair     Multiple surgeries to allow her to walk post polio     TONSILLECTOMY  1952   TUBAL LIGATION      Social History:  reports that she quit smoking about 14 years ago. Her smoking use included cigarettes. She started smoking about 24 years ago. She has a 5 pack-year smoking history. She has never used smokeless tobacco. She reports current alcohol use. She reports that she does not use drugs.  Allergies: Allergies  Allergen Reactions   Morphine And Codeine Other (See Comments)    crazy   Sulfa  Antibiotics Swelling and Other (See Comments)    swollen joints and extreme pain    Family History:  Family History  Problem Relation Age of Onset   Alcohol abuse Mother    Arthritis Mother    Arthritis Father    Colon cancer Neg Hx      Current Outpatient Medications:    albuterol  (PROVENTIL ) (2.5 MG/3ML) 0.083% nebulizer solution, Take 3 mLs (2.5 mg total) by nebulization 4 (four) times daily as needed., Disp: 75 mL, Rfl: 3   albuterol  (VENTOLIN  HFA) 108 (90 Base) MCG/ACT inhaler, USE 2 INHALATIONS BY MOUTH EVERY 6 HOURS AS NEEDED FOR WHEEZING  OR SHORTNESS OF BREATH, Disp: 34 g, Rfl: 2   atorvastatin  (LIPITOR) 10 MG tablet, TAKE 1 TABLET BY MOUTH DAILY, Disp: 100 tablet, Rfl: 1   Barberry-Oreg Grape-Goldenseal (BERBERINE COMPLEX PO), Take by mouth., Disp: , Rfl:    Bioflavonoid Products (ESTER-C) TABS, Take by mouth daily., Disp: , Rfl:    busPIRone  (BUSPAR ) 15 MG tablet, TAKE 1 TABLET BY MOUTH TWICE  DAILY, Disp: 200 tablet, Rfl: 1   ciprofloxacin (CIPRO) 500 MG tablet, Take 750 mg by mouth., Disp: , Rfl:    cyanocobalamin  1000 MCG tablet, Take 2,000 mcg by mouth daily., Disp: , Rfl:    losartan  (COZAAR ) 100 MG tablet, TAKE 1 TABLET BY MOUTH  DAILY, Disp: 100 tablet, Rfl: 1   magnesium 30 MG tablet, Take 30 mg by mouth 2 (two) times daily., Disp: , Rfl:    montelukast  (SINGULAIR ) 10 MG tablet, TAKE 1 TABLET BY MOUTH AT  BEDTIME, Disp: 100 tablet, Rfl: 1   sertraline  (ZOLOFT ) 50 MG tablet, TAKE 1 TABLET BY MOUTH DAILY, Disp: 100 tablet, Rfl: 0   SYMBICORT  160-4.5 MCG/ACT inhaler, USE 2 INHALATIONS BY MOUTH TWICE DAILY, Disp: 30.6 g, Rfl: 3  Review of Systems:  Negative unless indicated in HPI.   Physical Exam: Vitals:   05/28/24 1024 05/28/24 1031  BP: (!) 150/90 (!) 152/89  Pulse: 82   Temp: 97.7 F (36.5 C)   TempSrc: Oral   SpO2: 93%   Weight: 170 lb 3.2 oz (77.2 kg)     Body mass index is 33.24 kg/m.   Physical Exam Cardiovascular:     Pulses:           Dorsalis pedis pulses are 1+ on the left side.  Musculoskeletal:       Feet:  Feet:     Comments: Erythema and edema on the dorsum of the left foot and the great toe.     Impression and Plan:  Hospital discharge follow-up  IGT (impaired glucose tolerance) -     POCT glycosylated hemoglobin (Hb A1C)  Osteomyelitis of great toe of left foot (HCC) -     Ambulatory referral to Orthopedic Surgery -     Ambulatory referral to Infectious Disease  Immunization due  -ED charts and radiology charts have been reviewed in detail. - She has left great toe osteomyelitis with surrounding cellulitis.  Continue Cipro for now.  Will place referral to both foot and ankle orthopedics and infectious disease for management. - Flu vaccine given in office today.   Time spent:33 minutes reviewing chart, interviewing and examining patient and formulating plan of care.     Tully Theophilus Andrews, MD [facility]

## 2024-05-29 ENCOUNTER — Ambulatory Visit: Admitting: Internal Medicine

## 2024-05-29 ENCOUNTER — Other Ambulatory Visit: Payer: Self-pay

## 2024-05-29 ENCOUNTER — Telehealth: Payer: Self-pay | Admitting: Physician Assistant

## 2024-05-29 DIAGNOSIS — M869 Osteomyelitis, unspecified: Secondary | ICD-10-CM

## 2024-05-29 MED ORDER — CIPROFLOXACIN HCL 500 MG PO TABS: 750.0000 mg | ORAL_TABLET | Freq: Two times a day (BID) | ORAL | 0 refills | Status: AC

## 2024-05-29 MED ORDER — LINEZOLID 600 MG PO TABS: 600.0000 mg | ORAL_TABLET | Freq: Two times a day (BID) | ORAL | 0 refills | Status: AC

## 2024-05-29 NOTE — Patient Instructions (Addendum)
 Please call ortho for bone biopsy

## 2024-05-29 NOTE — Telephone Encounter (Signed)
 Pt called statin that the infectious Dr. Dennise that she saw today wants her to have a biopsy ASAP. Pt call back number is [phone]. Pt also stated that she needed to be seen before next Thursday.

## 2024-05-29 NOTE — Progress Notes (Signed)
 Patient: Wendy Yates  DOB: 1945-11-18 MRN: [id number] PCP: Theophilus Andrews, Tully GRADE, MD    Chief Complaint  Patient presents with   New Patient (Initial Visit)    OM of great toe      Patient Active Problem List   Diagnosis Date Noted   Osteoporosis 01/18/2022   IGT (impaired glucose tolerance) 06/29/2021   Tear of left glenoid labrum 12/26/2017   Routine general medical examination at a health care facility 04/20/2015   Hyperlipidemia 11/28/2011   Essential hypertension    Asthma, extrinsic    Osteopenia    Post-polio syndrome (HCC)    Depression    Allergic rhinitis    Scoliosis      Subjective:  ROSAISELA Yates is a 78 y.o. female with past medical history of anxiety/depression, hyperlipidemia, hypertension, allergic rhinitis, scoliosis, urinary incontinence, postpolio syndrome presents for management of osteomyelitis.  She is followed by PCP Dr. Sammuel who noted 2 weeks ago she had dropped a glass Pyrex on the left big toe.  Significant for blister.  Sent to the ED on October 1.  X-ray showed concern for osteomyelitis.  MRI done on October 6 showed acute osteomyelitis distal phalanx of great toe.  She was placed on Cipro and referred to infectious disease.  Review of Systems  All other systems reviewed and are negative.   Past Medical History:  Diagnosis Date   Allergic rhinitis, cause unspecified    Anxiety    Asthma, extrinsic    Depression    Hyperlipidemia    Hypertension    Osteopenia    DEXA 02/2012: -2.0; 12/19/07: -2.1; 08/17/04: -1.6   Post-polio syndrome (HCC)    polio age 23,  RLE most affected   Scoliosis    1979 harrington rod surgery   Urine incontinence     Outpatient Medications Prior to Visit  Medication Sig Dispense Refill   albuterol  (PROVENTIL ) (2.5 MG/3ML) 0.083% nebulizer solution Take 3 mLs (2.5 mg total) by nebulization 4 (four) times daily as needed. 75 mL 3   albuterol  (VENTOLIN  HFA) 108 (90 Base) MCG/ACT inhaler USE 2  INHALATIONS BY MOUTH EVERY 6 HOURS AS NEEDED FOR WHEEZING  OR SHORTNESS OF BREATH 34 g 2   atorvastatin  (LIPITOR) 10 MG tablet TAKE 1 TABLET BY MOUTH DAILY 100 tablet 1   Barberry-Oreg Grape-Goldenseal (BERBERINE COMPLEX PO) Take by mouth.     Bioflavonoid Products (ESTER-C) TABS Take by mouth daily.     busPIRone  (BUSPAR ) 15 MG tablet TAKE 1 TABLET BY MOUTH TWICE  DAILY 200 tablet 1   ciprofloxacin (CIPRO) 500 MG tablet Take 750 mg by mouth.     cyanocobalamin  1000 MCG tablet Take 2,000 mcg by mouth daily.     losartan  (COZAAR ) 100 MG tablet TAKE 1 TABLET BY MOUTH DAILY 100 tablet 1   magnesium 30 MG tablet Take 30 mg by mouth 2 (two) times daily.     montelukast  (SINGULAIR ) 10 MG tablet TAKE 1 TABLET BY MOUTH AT  BEDTIME 100 tablet 1   sertraline  (ZOLOFT ) 50 MG tablet TAKE 1 TABLET BY MOUTH DAILY 100 tablet 0   SYMBICORT  160-4.5 MCG/ACT inhaler USE 2 INHALATIONS BY MOUTH TWICE DAILY 30.6 g 3   No facility-administered medications prior to visit.     Allergies  Allergen Reactions   Morphine And Codeine Other (See Comments)    crazy   Sulfa Antibiotics Swelling and Other (See Comments)    swollen joints and extreme pain  Social History   Tobacco Use   Smoking status: Former    Current packs/day: 0.00    Average packs/day: 0.5 packs/day for 10.0 years (5.0 ttl pk-yrs)    Types: Cigarettes    Start date: 08/22/1999    Quit date: 08/21/2009    Years since quitting: 14.7   Smokeless tobacco: Never  Substance Use Topics   Alcohol use: Yes    Comment: maybe once a month per pt.   Drug use: No    Family History  Problem Relation Age of Onset   Alcohol abuse Mother    Arthritis Mother    Arthritis Father    Colon cancer Neg Hx     Objective:  There were no vitals filed for this visit. There is no height or weight on file to calculate BMI.  Physical Exam Constitutional:      Appearance: Normal appearance.  HENT:     Head: Normocephalic and atraumatic.     Right Ear:  Tympanic membrane normal.     Left Ear: Tympanic membrane normal.     Nose: Nose normal.     Mouth/Throat:     Mouth: Mucous membranes are moist.  Eyes:     Extraocular Movements: Extraocular movements intact.     Conjunctiva/sclera: Conjunctivae normal.     Pupils: Pupils are equal, round, and reactive to light.  Cardiovascular:     Rate and Rhythm: Normal rate and regular rhythm.     Heart sounds: No murmur heard.    No friction rub. No gallop.  Pulmonary:     Effort: Pulmonary effort is normal.     Breath sounds: Normal breath sounds.  Abdominal:     General: Abdomen is flat.     Palpations: Abdomen is soft.  Skin:    General: Skin is warm and dry.  Neurological:     General: No focal deficit present.     Mental Status: She is alert and oriented to person, place, and time.  Psychiatric:        Mood and Affect: Mood normal.        Lab Results: Lab Results  Component Value Date   WBC 4.0 07/02/2023   HGB 15.2 (H) 07/02/2023   HCT 46.0 07/02/2023   MCV 94.6 07/02/2023   PLT 285.0 07/02/2023    Lab Results  Component Value Date   CREATININE 0.56 07/02/2023   BUN 13 07/02/2023   NA 136 07/02/2023   K 3.9 07/02/2023   CL 97 07/02/2023   CO2 30 07/02/2023    Lab Results  Component Value Date   ALT 11 07/02/2023   AST 24 07/02/2023   ALKPHOS 88 07/02/2023   BILITOT 1.1 07/02/2023     Assessment & Plan:  #Acute osteomyelitis of great toe distal phalanx #post polis syndrome - Patient dropped a piece of Pyrex on her foot about 2 weeks ago. She was  then seen in the ED on October 1 were x-ray was concerning for osteomyelitis and she was placed on ciprofloxacin.  - blister resolved. Some tenderness on deep palpation of plantar foot. Pt tstataes the erythema is somewhat basline. Foot swelling basline given  post polio syndrome, she has had muliple hw in place. Imaging does not note any HW involvemnt -labs -Add linezolid, continue ciprofloxacin. PT sees ortho,  would ideally like a biopsy given that she has HW and complicated history. If no biopsy and pt continues to improve then complete 6 weeks of abx with linezolid ->doxy+ cipro  x 6 weeks eot 11/11 Follow up  in 2 weeks with ID  Loney Stank, MD [facility] for Infectious Disease [facility]   05/29/24  1:54 PM I have personally spent 70 minutes involved in face-to-face and non-face-to-face activities for this patient on the day of the visit. Professional time spent includes the following activities: Preparing to see the patient (review of tests), Obtaining and/or reviewing separately obtained history (admission/discharge record), Performing a medically appropriate examination and/or evaluation , Ordering medications/tests/procedures, referring and communicating with other health care professionals, Documenting clinical information in the EMR, Independently interpreting results (not separately reported), Communicating results to the patient/family/caregiver, Counseling and educating the patient/family/caregiver and Care coordination (not separately reported).

## 2024-05-30 ENCOUNTER — Telehealth: Payer: Self-pay | Admitting: Family

## 2024-05-30 ENCOUNTER — Ambulatory Visit: Admitting: Family

## 2024-05-30 ENCOUNTER — Other Ambulatory Visit (INDEPENDENT_AMBULATORY_CARE_PROVIDER_SITE_OTHER): Payer: Self-pay

## 2024-05-30 DIAGNOSIS — M79675 Pain in left toe(s): Secondary | ICD-10-CM

## 2024-05-30 LAB — COMPLETE METABOLIC PANEL WITHOUT GFR
AG Ratio: 1.8 (calc) (ref 1.0–2.5)
ALT: 11 U/L (ref 6–29)
AST: 21 U/L (ref 10–35)
Albumin: 4.4 g/dL (ref 3.6–5.1)
Alkaline phosphatase (APISO): 102 U/L (ref 37–153)
BUN: 16 mg/dL (ref 7–25)
CO2: 30 mmol/L (ref 20–32)
Calcium: 9.6 mg/dL (ref 8.6–10.4)
Chloride: 100 mmol/L (ref 98–110)
Creat: 0.94 mg/dL (ref 0.60–1.00)
Globulin: 2.4 g/dL (ref 1.9–3.7)
Glucose, Bld: 113 mg/dL — ABNORMAL HIGH (ref 65–99)
Potassium: 4.6 mmol/L (ref 3.5–5.3)
Sodium: 138 mmol/L (ref 135–146)
Total Bilirubin: 0.4 mg/dL (ref 0.2–1.2)
Total Protein: 6.8 g/dL (ref 6.1–8.1)

## 2024-05-30 LAB — CBC WITH DIFFERENTIAL/PLATELET
Absolute Lymphocytes: 1490 {cells}/uL (ref 850–3900)
Absolute Monocytes: 675 {cells}/uL (ref 200–950)
Basophils Absolute: 92 {cells}/uL (ref 0–200)
Basophils Relative: 1.7 %
Eosinophils Absolute: 394 {cells}/uL (ref 15–500)
Eosinophils Relative: 7.3 %
HCT: 45.4 % — ABNORMAL HIGH (ref 35.0–45.0)
Hemoglobin: 14.7 g/dL (ref 11.7–15.5)
MCH: 30.8 pg (ref 27.0–33.0)
MCHC: 32.4 g/dL (ref 32.0–36.0)
MCV: 95 fL (ref 80.0–100.0)
MPV: 9.9 fL (ref 7.5–12.5)
Monocytes Relative: 12.5 %
Neutro Abs: 2749 {cells}/uL (ref 1500–7800)
Neutrophils Relative %: 50.9 %
Platelets: 314 Thousand/uL (ref 140–400)
RBC: 4.78 Million/uL (ref 3.80–5.10)
RDW: 12.9 % (ref 11.0–15.0)
Total Lymphocyte: 27.6 %
WBC: 5.4 Thousand/uL (ref 3.8–10.8)

## 2024-05-30 LAB — SEDIMENTATION RATE: Sed Rate: 9 mm/h (ref 0–30)

## 2024-05-30 LAB — C-REACTIVE PROTEIN: CRP: 4.1 mg/L (ref <8.0)

## 2024-05-30 NOTE — Telephone Encounter (Signed)
 Pt asked for a call from [place]. Pt has one more medication question. Please call pt at [phone] Pt want to know if she only need to take the Cipro and Zyvox or just the Cipro.

## 2024-05-30 NOTE — Telephone Encounter (Signed)
 I called the pt and advised that I did not see that ID wanted a biopsy and the pt was not clear on what type of biopsy. She was given rx for Linezolid by ID and advised to take this with the cipro. Advised we cnnot see xrays only rad report and to come in today to take a new xray and see what she has going on.

## 2024-05-31 ENCOUNTER — Ambulatory Visit: Payer: Self-pay | Admitting: Internal Medicine

## 2024-05-31 ENCOUNTER — Encounter: Payer: Self-pay | Admitting: Family

## 2024-05-31 NOTE — Progress Notes (Signed)
 Office Visit Note   Patient: Wendy Yates           Date of Birth: 17-Jan-1946           MRN: [id number] Visit Date: 05/30/2024              Requested by: Theophilus Andrews, Tully GRADE, MD [address] [place],  [place] [address] PCP: Theophilus Andrews, Tully GRADE, MD  Chief Complaint  Patient presents with   Left Foot - Follow-up      HPI: The patient is a 78 year old woman who returns in follow-up for ulceration over the dorsum of the left great toe.  This initially occurred after she dropped a Pyrex dish onto the toe.  In the for several days she developed a significant bulla this was unroofed.  She has been seen in our office as well as [facility] for this concern.  She has also seen infectious diseases  The skin has improved remarkably she has followed with ID and is currently on a course of Cipro as well as linezolid.  Did have MRI of the great toe at [facility] which was concerning for osteomyelitis  Assessment & Plan: Visit Diagnoses:  1. Great toe pain, left     Plan: The toe is nearly fully healed the patient is without any systemic symptoms will continue with conservative measures  Reassurance provided.  She will follow-up with Dr. Harden as scheduled next week for definitive plan  Follow-Up Instructions: No follow-ups on file.   Ortho Exam  Patient is alert, oriented, no adenopathy, well-dressed, normal affect, normal respiratory effort.  Patient walks with a rollator, shuffling gait -postpolio.  On examination left foot the foot itself is in its baseline state with mild erythema and no edema. the great toe with minimal edema dorsally the wound is now 3 mm in diameter covered with dried blood there is no maceration no active drainage no foul odor no bony tenderness    Imaging: No results found. No images are attached to the encounter.  FINDINGS:  * Soft tissue wound at the dorsomedial aspect of the great toe IP joint.  *  Edema like signal of the great toe  distal phalanx, in keeping with acute osteomyelitis.  *  No loculated fluid collections.  *  No acute fractures or dislocations.  *  Mild first MTP and midfoot joints degenerative changes.  *  Diffuse atrophy of the intrinsic foot musculature, likely denervation changes related to diabetes.  *  Nonspecific dorsal subcutaneous edema about the midfoot and forefoot.  *  Intact Lisfranc ligament.  *  Partially imaged hardware within the hindfoot with severe tibiotalar joint degenerative changes and subchondral cystic changes.   IMPRESSION:  1. Acute osteomyelitis of the great toe distal phalanx.  2.  Diffuse atrophy of the intrinsic foot musculature, likely denervation changes related to diabetes.   Labs: Lab Results  Component Value Date   HGBA1C 5.7 (A) 05/28/2024   HGBA1C 6.1 07/02/2023   HGBA1C 6.1 06/29/2022   ESRSEDRATE 9 05/29/2024   CRP 4.1 05/29/2024     Lab Results  Component Value Date   ALBUMIN 4.3 07/02/2023   ALBUMIN 4.8 04/05/2023   ALBUMIN 4.3 06/29/2022    No results found for: MG Lab Results  Component Value Date   VD25OH 53.28 07/02/2023   VD25OH 68.65 06/29/2022   VD25OH 58.05 06/28/2021    No results found for: PREALBUMIN    Latest Ref Rng & Units 05/29/2024  2:27 PM 07/02/2023    8:34 AM 04/05/2023    3:22 PM  CBC EXTENDED  WBC 3.8 - 10.8 Thousand/uL 5.4  4.0  3.4   RBC 3.80 - 5.10 Million/uL 4.78  4.86  5.30   Hemoglobin 11.7 - 15.5 g/dL 85.2  84.7  83.9   HCT 35.0 - 45.0 % 45.4  46.0  49.0   Platelets 140 - 400 Thousand/uL 314  285.0  289   NEUT# 1,500 - 7,800 cells/uL 2,749  1.5    Lymph# 0.7 - 4.0 K/uL  1.4       There is no height or weight on file to calculate BMI.  Orders:  Orders Placed This Encounter  Procedures   XR Toe Great Left   No orders of the defined types were placed in this encounter.    Procedures: No procedures performed  Clinical Data: No additional findings.  ROS:  All other systems negative, except  as noted in the HPI. Review of Systems  Objective: Vital Signs: There were no vitals taken for this visit.  Specialty Comments:  No specialty comments available.  PMFS History: Patient Active Problem List   Diagnosis Date Noted   Osteoporosis 01/18/2022   IGT (impaired glucose tolerance) 06/29/2021   Tear of left glenoid labrum 12/26/2017   Routine general medical examination at a health care facility 04/20/2015   Hyperlipidemia 11/28/2011   Essential hypertension    Asthma, extrinsic    Osteopenia    Post-polio syndrome (HCC)    Depression    Allergic rhinitis    Scoliosis    Past Medical History:  Diagnosis Date   Allergic rhinitis, cause unspecified    Anxiety    Asthma, extrinsic    Depression    Hyperlipidemia    Hypertension    Osteopenia    DEXA 02/2012: -2.0; 12/19/07: -2.1; 08/17/04: -1.6   Post-polio syndrome (HCC)    polio age 23,  RLE most affected   Scoliosis    1979 harrington rod surgery   Urine incontinence     Family History  Problem Relation Age of Onset   Alcohol abuse Mother    Arthritis Mother    Arthritis Father    Colon cancer Neg Hx     Past Surgical History:  Procedure Laterality Date   BACK SURGERY  1979   harrington rod inserted   BREAST BIOPSY     CHOLECYSTECTOMY  1969   Left hammertoe repair     Multiple surgeries to allow her to walk post polio     TONSILLECTOMY  1952   TUBAL LIGATION     Social History   Occupational History   Not on file  Tobacco Use   Smoking status: Former    Current packs/day: 0.00    Average packs/day: 0.5 packs/day for 10.0 years (5.0 ttl pk-yrs)    Types: Cigarettes    Start date: 08/22/1999    Quit date: 08/21/2009    Years since quitting: 14.7   Smokeless tobacco: Never  Substance and Sexual Activity   Alcohol use: Yes    Comment: maybe once a month per pt.   Drug use: No   Sexual activity: Not on file

## 2024-06-01 ENCOUNTER — Encounter: Payer: Self-pay | Admitting: Internal Medicine

## 2024-06-02 NOTE — Progress Notes (Unsigned)
 Office Visit Note   Patient: Wendy Yates           Date of Birth: 03-03-1946           MRN: [id number] Visit Date: 05/28/2024              Requested by: Theophilus Andrews, Tully GRADE, MD [address] [place],  [place] [address] PCP: Theophilus Andrews, Tully GRADE, MD  Chief Complaint  Patient presents with   Left Foot - Wound Check      HPI: The patient is a 78 year old woman who returns in follow-up for ulceration over the dorsum of the left great toe.  She dropped a pyrex dish on her toe 2 weeks earlier.  She had significant bruising and development of a blood blister that led her to visiting the emergency department on October 1. 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  She was originally sent to wound care, but they were not able to see her for several weeks.  She is now here for follow.  Assessment & Plan: Visit Diagnoses: No diagnosis found.  Plan: Vashe daily cleaning and dry dressing.    Follow-Up Instructions: Return in about 1 week (around 06/04/2024) for x ray.   Ortho Exam  Patient is alert, oriented, no adenopathy, well-dressed, normal affect, normal respiratory effort. The post blister wound bed appears healthy.  The toe has good cap refill.  She has good doppler signals PT/DP/Peroneal.  No cellulitis or drainage.      Imaging: X ray and MRI were reviewed.  Labs: Lab Results  Component Value Date   HGBA1C 5.7 (A) 05/28/2024   HGBA1C 6.1 07/02/2023   HGBA1C 6.1 06/29/2022   ESRSEDRATE 9 05/29/2024   CRP 4.1 05/29/2024     Lab Results  Component Value Date   ALBUMIN 4.3 07/02/2023   ALBUMIN 4.8 04/05/2023   ALBUMIN 4.3 06/29/2022    No results found for: MG Lab Results  Component Value Date   VD25OH 53.28 07/02/2023   VD25OH 68.65  06/29/2022   VD25OH 58.05 06/28/2021    No results found for: PREALBUMIN    Latest Ref Rng & Units 05/29/2024    2:27 PM 07/02/2023    8:34 AM 04/05/2023    3:22 PM  CBC EXTENDED  WBC 3.8 - 10.8 Thousand/uL 5.4  4.0  3.4   RBC 3.80 - 5.10 Million/uL 4.78  4.86  5.30   Hemoglobin 11.7 - 15.5 g/dL 85.2  84.7  83.9   HCT 35.0 - 45.0 % 45.4  46.0  49.0   Platelets 140 - 400 Thousand/uL 314  285.0  289   NEUT# 1,500 - 7,800 cells/uL 2,749  1.5    Lymph# 0.7 - 4.0 K/uL  1.4       There is no height or weight on file to calculate BMI.  Orders:  No orders of the defined types were placed in this encounter.  No orders of the defined types were placed in this encounter.    Procedures: No procedures performed  Clinical Data: No additional findings.  ROS:  All other systems negative, except as noted in the HPI. Review of Systems  Objective: Vital Signs: There were no vitals taken for this visit.  Specialty Comments:  No specialty comments  available.  PMFS History: Patient Active Problem List   Diagnosis Date Noted   Osteoporosis 01/18/2022   IGT (impaired glucose tolerance) 06/29/2021   Tear of left glenoid labrum 12/26/2017   Routine general medical examination at a health care facility 04/20/2015   Hyperlipidemia 11/28/2011   Essential hypertension    Asthma, extrinsic    Osteopenia    Post-polio syndrome (HCC)    Depression    Allergic rhinitis    Scoliosis    Past Medical History:  Diagnosis Date   Allergic rhinitis, cause unspecified    Anxiety    Asthma, extrinsic    Depression    Hyperlipidemia    Hypertension    Osteopenia    DEXA 02/2012: -2.0; 12/19/07: -2.1; 08/17/04: -1.6   Post-polio syndrome (HCC)    polio age 16,  RLE most affected   Scoliosis    1979 harrington rod surgery   Urine incontinence     Family History  Problem Relation Age of Onset   Alcohol abuse Mother    Arthritis Mother    Arthritis Father    Colon cancer Neg Hx      Past Surgical History:  Procedure Laterality Date   BACK SURGERY  1979   harrington rod inserted   BREAST BIOPSY     CHOLECYSTECTOMY  1969   Left hammertoe repair     Multiple surgeries to allow her to walk post polio     TONSILLECTOMY  1952   TUBAL LIGATION     Social History   Occupational History   Not on file  Tobacco Use   Smoking status: Former    Current packs/day: 0.00    Average packs/day: 0.5 packs/day for 10.0 years (5.0 ttl pk-yrs)    Types: Cigarettes    Start date: 08/22/1999    Quit date: 08/21/2009    Years since quitting: 14.7   Smokeless tobacco: Never  Substance and Sexual Activity   Alcohol use: Yes    Comment: maybe once a month per pt.   Drug use: No   Sexual activity: Not on file

## 2024-06-03 ENCOUNTER — Encounter: Payer: Self-pay | Admitting: Physician Assistant

## 2024-06-05 ENCOUNTER — Encounter: Payer: Self-pay | Admitting: Physician Assistant

## 2024-06-05 ENCOUNTER — Ambulatory Visit: Admitting: Physician Assistant

## 2024-06-05 DIAGNOSIS — S90112A Contusion of left great toe without damage to nail, initial encounter: Secondary | ICD-10-CM | POA: Diagnosis not present

## 2024-06-05 NOTE — Progress Notes (Signed)
 Office Visit Note   Patient: Wendy Yates           Date of Birth: 30-Dec-1945           MRN: [id number] Visit Date: 06/05/2024              Requested by: Theophilus Andrews, Tully GRADE, MD [address] [place],  [place] [address] PCP: Theophilus Andrews, Tully GRADE, MD  Chief Complaint  Patient presents with   Left Foot - Wound Check      HPI: Expand All Collapse All     Office Visit Note              Patient: Wendy Yates                                        Date of Birth: Apr 20, 1946                                                 MRN: [id number] Visit Date: 05/28/2024                                                                     Requested by: Theophilus Andrews, Tully GRADE, MD [address] [place],  [place] [address] PCP: Theophilus Andrews, Tully GRADE, MD      Chief Complaint  Patient presents with   Left Foot - Wound Check          HPI: The patient is a 78 year old woman who returns in follow-up for ulceration over the dorsum of the left great toe.  She dropped a pyrex dish on her toe 2 weeks earlier on 05/18/24.  She had significant bruising and development of a blood blister that led her to visiting the emergency department on October 1. 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  The trauma caused bone marrow edema.  She was originally sent to wound care, but they were not able to see her for several weeks.  She is now here for follow.     She was placed on oral antibiotics Keflex for 5 days she was then changed to Ciprofloxacin.    The blister was removed.  She was sent for an MRI which read bone edema and suggested Osteomyelitis.  She was seen by infectious disease on 05/29/24.  They suggested adding Linezoid and continuing Cipro +  Doxycycline .    She was then seen back at our office with instruction for biopsy per ID.  An xray was taken which was negative for bone Bone destruction (erosions or lucencies) Joint effusion (if the infection has spread to the joint).    She has continued to use Vashe wound cleanser and a dry dressing daily.  She denies fever and chills.  Assessment & Plan: Visit Diagnoses:  1. Contusion of left great toe without damage to nail, initial encounter     Plan: Continue the Cipro and Doxycycline .  Shower with soap and water daily PRN.  She is not planning on picking up the Linezoid antibiotic and she has cancelled her f/u with ID.  I will take a repeat x ray at her next follow up.     Follow-Up Instructions: Return in about 2 weeks (around 06/19/2024).   Ortho Exam  Patient is alert, oriented, no adenopathy, well-dressed, normal affect, normal respiratory effort. No abnormal warmth to touch, Palpable DP pulse, non tender to palpation over the GT and second toe.  New skin appears healthy and viable.  No open wound or edema over the GT dorsum.  No cellulitis. Nail be with small area of hematoma.      Imaging: No results found.     Labs: Lab Results  Component Value Date   HGBA1C 5.7 (A) 05/28/2024   HGBA1C 6.1 07/02/2023   HGBA1C 6.1 06/29/2022   ESRSEDRATE 9 05/29/2024   CRP 4.1 05/29/2024     Lab Results  Component Value Date   ALBUMIN 4.3 07/02/2023   ALBUMIN 4.8 04/05/2023   ALBUMIN 4.3 06/29/2022    No results found for: MG Lab Results  Component Value Date   VD25OH 53.28 07/02/2023   VD25OH 68.65 06/29/2022   VD25OH 58.05 06/28/2021    No results found for: PREALBUMIN    Latest Ref Rng & Units 05/29/2024    2:27 PM 07/02/2023    8:34 AM 04/05/2023    3:22 PM  CBC EXTENDED  WBC 3.8 - 10.8 Thousand/uL 5.4  4.0  3.4   RBC 3.80 - 5.10 Million/uL 4.78  4.86  5.30   Hemoglobin 11.7 - 15.5 g/dL 85.2  84.7  83.9   HCT 35.0 - 45.0 % 45.4  46.0  49.0    Platelets 140 - 400 Thousand/uL 314  285.0  289   NEUT# 1,500 - 7,800 cells/uL 2,749  1.5    Lymph# 0.7 - 4.0 K/uL  1.4       There is no height or weight on file to calculate BMI.  Orders:  No orders of the defined types were placed in this encounter.  No orders of the defined types were placed in this encounter.    Procedures: No procedures performed  Clinical Data: No additional findings.  ROS:  All other systems negative, except as noted in the HPI. Review of Systems  Objective: Vital Signs: There were no vitals taken for this visit.  Specialty Comments:  No specialty comments available.  PMFS History: Patient Active Problem List   Diagnosis Date Noted   Osteoporosis 01/18/2022   IGT (impaired glucose tolerance) 06/29/2021   Tear of left glenoid labrum 12/26/2017   Routine general medical examination at a health care facility 04/20/2015   Hyperlipidemia 11/28/2011   Essential hypertension    Asthma, extrinsic    Osteopenia    Post-polio syndrome (HCC)    Depression    Allergic rhinitis    Scoliosis    Past Medical History:  Diagnosis Date   Allergic rhinitis, cause unspecified    Anxiety    Asthma, extrinsic    Depression    Hyperlipidemia    Hypertension    Osteopenia    DEXA 02/2012: -2.0; 12/19/07: -2.1; 08/17/04: -1.6   Post-polio syndrome (HCC)    polio age 35,  RLE most affected   Scoliosis  1979 harrington rod surgery   Urine incontinence     Family History  Problem Relation Age of Onset   Alcohol abuse Mother    Arthritis Mother    Arthritis Father    Colon cancer Neg Hx     Past Surgical History:  Procedure Laterality Date   BACK SURGERY  1979   harrington rod inserted   BREAST BIOPSY     CHOLECYSTECTOMY  1969   Left hammertoe repair     Multiple surgeries to allow her to walk post polio     TONSILLECTOMY  1952   TUBAL LIGATION     Social History   Occupational History   Not on file  Tobacco Use   Smoking status:  Former    Current packs/day: 0.00    Average packs/day: 0.5 packs/day for 10.0 years (5.0 ttl pk-yrs)    Types: Cigarettes    Start date: 08/22/1999    Quit date: 08/21/2009    Years since quitting: 14.8   Smokeless tobacco: Never  Substance and Sexual Activity   Alcohol use: Yes    Comment: maybe once a month per pt.   Drug use: No   Sexual activity: Not on file

## 2024-06-08 ENCOUNTER — Encounter: Payer: Self-pay | Admitting: Physician Assistant

## 2024-06-11 ENCOUNTER — Ambulatory Visit: Admitting: Internal Medicine

## 2024-06-12 ENCOUNTER — Ambulatory Visit: Admitting: Internal Medicine

## 2024-06-17 ENCOUNTER — Encounter: Payer: Self-pay | Admitting: Internal Medicine

## 2024-06-19 ENCOUNTER — Ambulatory Visit: Admitting: Physician Assistant

## 2024-06-19 ENCOUNTER — Encounter: Payer: Self-pay | Admitting: Physician Assistant

## 2024-06-19 ENCOUNTER — Other Ambulatory Visit: Payer: Self-pay

## 2024-06-19 DIAGNOSIS — S90112A Contusion of left great toe without damage to nail, initial encounter: Secondary | ICD-10-CM

## 2024-06-19 NOTE — Progress Notes (Signed)
 Office Visit Note   Patient: Wendy Yates           Date of Birth: May 30, 1946           MRN: [id number] Visit Date: 06/19/2024              Requested by: Theophilus Andrews, Tully GRADE, MD [address] [place],  [place] [address] PCP: Theophilus Andrews, Tully GRADE, MD  Chief Complaint  Patient presents with   Left Foot - Follow-up      HPI: The patient is a 78 year old woman who returns in follow-up for ulceration over the dorsum of the left great toe.  She dropped a pyrex dish on her toe 2 weeks earlier on 05/18/24.  She had significant bruising and development of a blood blister that led her to visiting the emergency department on October 1.   She has been taking oral antibiotics both Cipro and Doxycycline .  We plan to repeat x ray today to check for bony healing of the medial boarder distal phalanx.  Assessment & Plan: Visit Diagnoses:  1. Contusion of left great toe without damage to nail, initial encounter     Plan: Donut cut to place in shoe to take pressure of plantar callus.  Activity as tolerates in comfortable shoe.  No wound care needs the GT is fully healed.  F/U for callus trimming as needed in the future.   Follow-Up Instructions: Return if symptoms worsen or fail to improve.   Ortho Exam  Patient is alert, oriented, no adenopathy, well-dressed, normal affect, normal respiratory effort. No abnormal warmth to touch, Palpable DP pulse, non tender to palpation over the GT and second toe.  New skin appears healthy and viable.  No open wound or edema over the GT dorsum.  No cellulitis. 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  MTP flat callus without ulcer.      Imaging: GT without lytic lesions PIP and MTP joint of the GT is preserved.   Labs: Lab Results  Component Value Date   HGBA1C 5.7 (A) 05/28/2024   HGBA1C 6.1 07/02/2023   HGBA1C 6.1 06/29/2022   ESRSEDRATE 9 05/29/2024   CRP 4.1 05/29/2024     Lab Results  Component Value Date    ALBUMIN 4.3 07/02/2023   ALBUMIN 4.8 04/05/2023   ALBUMIN 4.3 06/29/2022    No results found for: MG Lab Results  Component Value Date   VD25OH 53.28 07/02/2023   VD25OH 68.65 06/29/2022   VD25OH 58.05 06/28/2021    No results found for: PREALBUMIN    Latest Ref Rng & Units 05/29/2024    2:27 PM 07/02/2023    8:34 AM 04/05/2023    3:22 PM  CBC EXTENDED  WBC 3.8 - 10.8 Thousand/uL 5.4  4.0  3.4   RBC 3.80 - 5.10 Million/uL 4.78  4.86  5.30   Hemoglobin 11.7 - 15.5 g/dL 85.2  84.7  83.9   HCT 35.0 - 45.0 % 45.4  46.0  49.0   Platelets 140 - 400 Thousand/uL 314  285.0  289   NEUT# 1,500 - 7,800 cells/uL 2,749  1.5    Lymph# 0.7 - 4.0 K/uL  1.4       There is no height or weight on file to calculate BMI.  Orders:  Orders Placed This Encounter  Procedures   XR Foot Complete Left   No orders of the defined types were placed in this encounter.    Procedures: No  procedures performed  Clinical Data: No additional findings.  ROS:  All other systems negative, except as noted in the HPI. Review of Systems  Objective: Vital Signs: There were no vitals taken for this visit.  Specialty Comments:  No specialty comments available.  PMFS History: Patient Active Problem List   Diagnosis Date Noted   Osteoporosis 01/18/2022   IGT (impaired glucose tolerance) 06/29/2021   Tear of left glenoid labrum 12/26/2017   Routine general medical examination at a health care facility 04/20/2015   Hyperlipidemia 11/28/2011   Essential hypertension    Asthma, extrinsic    Osteopenia    Post-polio syndrome (HCC)    Depression    Allergic rhinitis    Scoliosis    Past Medical History:  Diagnosis Date   Allergic rhinitis, cause unspecified    Anxiety    Asthma, extrinsic    Depression    Hyperlipidemia    Hypertension    Osteopenia    DEXA 02/2012: -2.0; 12/19/07: -2.1; 08/17/04: -1.6   Post-polio syndrome (HCC)    polio age 68,  RLE most affected   Scoliosis    1979  harrington rod surgery   Urine incontinence     Family History  Problem Relation Age of Onset   Alcohol abuse Mother    Arthritis Mother    Arthritis Father    Colon cancer Neg Hx     Past Surgical History:  Procedure Laterality Date   BACK SURGERY  1979   harrington rod inserted   BREAST BIOPSY     CHOLECYSTECTOMY  1969   Left hammertoe repair     Multiple surgeries to allow her to walk post polio     TONSILLECTOMY  1952   TUBAL LIGATION     Social History   Occupational History   Not on file  Tobacco Use   Smoking status: Former    Current packs/day: 0.00    Average packs/day: 0.5 packs/day for 10.0 years (5.0 ttl pk-yrs)    Types: Cigarettes    Start date: 08/22/1999    Quit date: 08/21/2009    Years since quitting: 14.8   Smokeless tobacco: Never  Substance and Sexual Activity   Alcohol use: Yes    Comment: maybe once a month per pt.   Drug use: No   Sexual activity: Not on file

## 2024-06-23 ENCOUNTER — Encounter: Payer: Self-pay | Admitting: Radiology

## 2024-07-01 ENCOUNTER — Other Ambulatory Visit: Payer: Self-pay | Admitting: Internal Medicine

## 2024-09-03 ENCOUNTER — Encounter: Admitting: Internal Medicine

## 2024-09-17 ENCOUNTER — Other Ambulatory Visit: Payer: Self-pay | Admitting: Internal Medicine
